# Patient Record
Sex: Female | Born: 1980
Health system: Southern US, Community
[De-identification: ages and names within clinical notes are randomized; demographics above are authoritative.]

## PROBLEM LIST (undated history)

## (undated) DIAGNOSIS — F988 Other specified behavioral and emotional disorders with onset usually occurring in childhood and adolescence: Secondary | ICD-10-CM

## (undated) DIAGNOSIS — I10 Essential (primary) hypertension: Secondary | ICD-10-CM

## (undated) DIAGNOSIS — J386 Stenosis of larynx: Secondary | ICD-10-CM

## (undated) DIAGNOSIS — E119 Type 2 diabetes mellitus without complications: Secondary | ICD-10-CM

## (undated) HISTORY — DX: Other specified behavioral and emotional disorders with onset usually occurring in childhood and adolescence: F98.8

## (undated) HISTORY — PX: BREAST BIOPSY: SHX20

---

## 2002-04-18 ENCOUNTER — Emergency Department (HOSPITAL_COMMUNITY): Admission: EM | Admit: 2002-04-18 | Discharge: 2002-04-18 | Payer: Self-pay | Admitting: Emergency Medicine

## 2002-05-27 ENCOUNTER — Emergency Department (HOSPITAL_COMMUNITY): Admission: EM | Admit: 2002-05-27 | Discharge: 2002-05-27 | Payer: Self-pay | Admitting: Emergency Medicine

## 2002-06-27 ENCOUNTER — Emergency Department (HOSPITAL_COMMUNITY): Admission: EM | Admit: 2002-06-27 | Discharge: 2002-06-27 | Payer: Self-pay | Admitting: Emergency Medicine

## 2002-06-27 ENCOUNTER — Encounter: Payer: Self-pay | Admitting: Emergency Medicine

## 2002-09-01 ENCOUNTER — Emergency Department (HOSPITAL_COMMUNITY): Admission: EM | Admit: 2002-09-01 | Discharge: 2002-09-01 | Payer: Self-pay | Admitting: Emergency Medicine

## 2002-10-21 ENCOUNTER — Emergency Department (HOSPITAL_COMMUNITY): Admission: EM | Admit: 2002-10-21 | Discharge: 2002-10-21 | Payer: Self-pay | Admitting: Emergency Medicine

## 2002-10-22 ENCOUNTER — Emergency Department (HOSPITAL_COMMUNITY): Admission: EM | Admit: 2002-10-22 | Discharge: 2002-10-23 | Payer: Self-pay | Admitting: Emergency Medicine

## 2002-10-22 ENCOUNTER — Encounter: Payer: Self-pay | Admitting: Emergency Medicine

## 2002-12-12 ENCOUNTER — Inpatient Hospital Stay (HOSPITAL_COMMUNITY): Admission: EM | Admit: 2002-12-12 | Discharge: 2002-12-14 | Payer: Self-pay | Admitting: Emergency Medicine

## 2002-12-12 ENCOUNTER — Encounter: Payer: Self-pay | Admitting: Emergency Medicine

## 2003-02-13 ENCOUNTER — Emergency Department (HOSPITAL_COMMUNITY): Admission: EM | Admit: 2003-02-13 | Discharge: 2003-02-14 | Payer: Self-pay | Admitting: Emergency Medicine

## 2003-02-13 ENCOUNTER — Encounter: Payer: Self-pay | Admitting: Emergency Medicine

## 2003-11-09 ENCOUNTER — Emergency Department (HOSPITAL_COMMUNITY): Admission: EM | Admit: 2003-11-09 | Discharge: 2003-11-09 | Payer: Self-pay

## 2003-12-02 ENCOUNTER — Emergency Department (HOSPITAL_COMMUNITY): Admission: EM | Admit: 2003-12-02 | Discharge: 2003-12-02 | Payer: Self-pay | Admitting: Emergency Medicine

## 2003-12-17 ENCOUNTER — Emergency Department (HOSPITAL_COMMUNITY): Admission: EM | Admit: 2003-12-17 | Discharge: 2003-12-17 | Payer: Self-pay | Admitting: Emergency Medicine

## 2004-01-18 ENCOUNTER — Emergency Department (HOSPITAL_COMMUNITY): Admission: EM | Admit: 2004-01-18 | Discharge: 2004-01-18 | Payer: Self-pay | Admitting: *Deleted

## 2004-02-02 ENCOUNTER — Emergency Department (HOSPITAL_COMMUNITY): Admission: EM | Admit: 2004-02-02 | Discharge: 2004-02-03 | Payer: Self-pay | Admitting: Emergency Medicine

## 2004-02-25 ENCOUNTER — Emergency Department (HOSPITAL_COMMUNITY): Admission: EM | Admit: 2004-02-25 | Discharge: 2004-02-25 | Payer: Self-pay | Admitting: Emergency Medicine

## 2004-03-18 ENCOUNTER — Emergency Department (HOSPITAL_COMMUNITY): Admission: EM | Admit: 2004-03-18 | Discharge: 2004-03-18 | Payer: Self-pay | Admitting: Emergency Medicine

## 2004-06-20 ENCOUNTER — Emergency Department (HOSPITAL_COMMUNITY): Admission: EM | Admit: 2004-06-20 | Discharge: 2004-06-20 | Payer: Self-pay | Admitting: Emergency Medicine

## 2004-08-18 ENCOUNTER — Emergency Department (HOSPITAL_COMMUNITY): Admission: EM | Admit: 2004-08-18 | Discharge: 2004-08-18 | Payer: Self-pay | Admitting: Emergency Medicine

## 2004-08-23 ENCOUNTER — Emergency Department (HOSPITAL_COMMUNITY): Admission: EM | Admit: 2004-08-23 | Discharge: 2004-08-23 | Payer: Self-pay | Admitting: Emergency Medicine

## 2004-08-24 ENCOUNTER — Emergency Department (HOSPITAL_COMMUNITY): Admission: EM | Admit: 2004-08-24 | Discharge: 2004-08-24 | Payer: Self-pay

## 2004-10-30 ENCOUNTER — Emergency Department (HOSPITAL_COMMUNITY): Admission: EM | Admit: 2004-10-30 | Discharge: 2004-10-30 | Payer: Self-pay | Admitting: *Deleted

## 2004-12-08 ENCOUNTER — Emergency Department (HOSPITAL_COMMUNITY): Admission: EM | Admit: 2004-12-08 | Discharge: 2004-12-08 | Payer: Self-pay | Admitting: Emergency Medicine

## 2005-01-06 ENCOUNTER — Emergency Department (HOSPITAL_COMMUNITY): Admission: EM | Admit: 2005-01-06 | Discharge: 2005-01-06 | Payer: Self-pay | Admitting: Emergency Medicine

## 2005-02-05 ENCOUNTER — Emergency Department (HOSPITAL_COMMUNITY): Admission: EM | Admit: 2005-02-05 | Discharge: 2005-02-05 | Payer: Self-pay | Admitting: Emergency Medicine

## 2005-02-15 ENCOUNTER — Emergency Department (HOSPITAL_COMMUNITY): Admission: EM | Admit: 2005-02-15 | Discharge: 2005-02-16 | Payer: Self-pay | Admitting: Emergency Medicine

## 2005-03-17 ENCOUNTER — Emergency Department (HOSPITAL_COMMUNITY): Admission: EM | Admit: 2005-03-17 | Discharge: 2005-03-17 | Payer: Self-pay | Admitting: Emergency Medicine

## 2005-04-12 ENCOUNTER — Emergency Department (HOSPITAL_COMMUNITY): Admission: EM | Admit: 2005-04-12 | Discharge: 2005-04-12 | Payer: Self-pay | Admitting: Emergency Medicine

## 2005-08-22 ENCOUNTER — Emergency Department (HOSPITAL_COMMUNITY): Admission: EM | Admit: 2005-08-22 | Discharge: 2005-08-22 | Payer: Self-pay | Admitting: Emergency Medicine

## 2005-09-12 ENCOUNTER — Emergency Department (HOSPITAL_COMMUNITY): Admission: EM | Admit: 2005-09-12 | Discharge: 2005-09-12 | Payer: Self-pay | Admitting: *Deleted

## 2005-11-23 ENCOUNTER — Emergency Department: Payer: Self-pay | Admitting: Emergency Medicine

## 2006-01-01 ENCOUNTER — Emergency Department: Payer: Self-pay | Admitting: Emergency Medicine

## 2006-02-22 ENCOUNTER — Emergency Department (HOSPITAL_COMMUNITY): Admission: EM | Admit: 2006-02-22 | Discharge: 2006-02-22 | Payer: Self-pay | Admitting: Emergency Medicine

## 2006-03-17 ENCOUNTER — Emergency Department (HOSPITAL_COMMUNITY): Admission: EM | Admit: 2006-03-17 | Discharge: 2006-03-17 | Payer: Self-pay | Admitting: Pediatrics

## 2006-06-03 ENCOUNTER — Emergency Department: Payer: Self-pay | Admitting: Emergency Medicine

## 2006-07-15 ENCOUNTER — Emergency Department (HOSPITAL_COMMUNITY): Admission: EM | Admit: 2006-07-15 | Discharge: 2006-07-15 | Payer: Self-pay | Admitting: Emergency Medicine

## 2006-07-26 ENCOUNTER — Ambulatory Visit: Payer: Self-pay | Admitting: Otolaryngology

## 2006-08-13 ENCOUNTER — Ambulatory Visit: Payer: Self-pay | Admitting: Unknown Physician Specialty

## 2007-01-07 ENCOUNTER — Ambulatory Visit: Payer: Self-pay | Admitting: Unknown Physician Specialty

## 2007-02-11 ENCOUNTER — Emergency Department: Payer: Self-pay | Admitting: Emergency Medicine

## 2007-02-17 ENCOUNTER — Emergency Department: Payer: Self-pay | Admitting: Emergency Medicine

## 2007-04-21 ENCOUNTER — Emergency Department: Payer: Self-pay | Admitting: Emergency Medicine

## 2007-06-13 ENCOUNTER — Ambulatory Visit: Payer: Self-pay | Admitting: Unknown Physician Specialty

## 2007-06-17 ENCOUNTER — Ambulatory Visit: Payer: Self-pay | Admitting: Unknown Physician Specialty

## 2007-08-23 ENCOUNTER — Emergency Department: Payer: Self-pay | Admitting: Emergency Medicine

## 2007-09-06 ENCOUNTER — Emergency Department (HOSPITAL_COMMUNITY): Admission: EM | Admit: 2007-09-06 | Discharge: 2007-09-06 | Payer: Self-pay | Admitting: Emergency Medicine

## 2007-10-20 ENCOUNTER — Emergency Department: Payer: Self-pay | Admitting: Emergency Medicine

## 2007-11-07 ENCOUNTER — Emergency Department (HOSPITAL_COMMUNITY): Admission: EM | Admit: 2007-11-07 | Discharge: 2007-11-07 | Payer: Self-pay | Admitting: Emergency Medicine

## 2007-12-19 DIAGNOSIS — E119 Type 2 diabetes mellitus without complications: Secondary | ICD-10-CM

## 2007-12-19 HISTORY — DX: Type 2 diabetes mellitus without complications: E11.9

## 2007-12-20 ENCOUNTER — Emergency Department: Payer: Self-pay | Admitting: Emergency Medicine

## 2008-01-28 ENCOUNTER — Emergency Department: Payer: Self-pay

## 2008-02-08 ENCOUNTER — Emergency Department: Payer: Self-pay | Admitting: Emergency Medicine

## 2008-02-08 ENCOUNTER — Other Ambulatory Visit: Payer: Self-pay

## 2008-04-25 ENCOUNTER — Emergency Department: Payer: Self-pay | Admitting: Emergency Medicine

## 2008-08-04 ENCOUNTER — Encounter: Admission: RE | Admit: 2008-08-04 | Discharge: 2008-08-04 | Payer: Self-pay | Admitting: Family Medicine

## 2008-08-26 ENCOUNTER — Ambulatory Visit (HOSPITAL_COMMUNITY): Admission: RE | Admit: 2008-08-26 | Discharge: 2008-08-26 | Payer: Self-pay | Admitting: Otolaryngology

## 2008-11-07 ENCOUNTER — Emergency Department: Payer: Self-pay | Admitting: Emergency Medicine

## 2008-11-11 ENCOUNTER — Ambulatory Visit (HOSPITAL_COMMUNITY): Admission: RE | Admit: 2008-11-11 | Discharge: 2008-11-12 | Payer: Self-pay | Admitting: Otolaryngology

## 2008-11-11 ENCOUNTER — Encounter (INDEPENDENT_AMBULATORY_CARE_PROVIDER_SITE_OTHER): Payer: Self-pay | Admitting: Otolaryngology

## 2008-12-18 HISTORY — PX: TRACHEOSTOMY: SUR1362

## 2009-03-15 ENCOUNTER — Encounter: Admission: RE | Admit: 2009-03-15 | Discharge: 2009-03-15 | Payer: Self-pay | Admitting: Family Medicine

## 2009-05-26 ENCOUNTER — Ambulatory Visit (HOSPITAL_COMMUNITY): Admission: RE | Admit: 2009-05-26 | Discharge: 2009-05-26 | Payer: Self-pay | Admitting: Otolaryngology

## 2009-05-27 ENCOUNTER — Inpatient Hospital Stay (HOSPITAL_COMMUNITY): Admission: EM | Admit: 2009-05-27 | Discharge: 2009-05-27 | Payer: Self-pay | Admitting: Emergency Medicine

## 2009-08-15 ENCOUNTER — Emergency Department: Payer: Self-pay | Admitting: Emergency Medicine

## 2009-10-25 ENCOUNTER — Encounter: Admission: RE | Admit: 2009-10-25 | Discharge: 2009-10-25 | Payer: Self-pay | Admitting: Family Medicine

## 2010-04-25 ENCOUNTER — Encounter: Admission: RE | Admit: 2010-04-25 | Discharge: 2010-04-25 | Payer: Self-pay | Admitting: Family Medicine

## 2011-03-27 LAB — GLUCOSE, CAPILLARY
Glucose-Capillary: 178 mg/dL — ABNORMAL HIGH (ref 70–99)
Glucose-Capillary: 184 mg/dL — ABNORMAL HIGH (ref 70–99)
Glucose-Capillary: 222 mg/dL — ABNORMAL HIGH (ref 70–99)
Glucose-Capillary: 265 mg/dL — ABNORMAL HIGH (ref 70–99)
Glucose-Capillary: 274 mg/dL — ABNORMAL HIGH (ref 70–99)

## 2011-03-27 LAB — BASIC METABOLIC PANEL
CO2: 29 mEq/L (ref 19–32)
Calcium: 9 mg/dL (ref 8.4–10.5)
GFR calc Af Amer: >60 mL/min (ref >60)
Glucose, Bld: 202 mg/dL — ABNORMAL HIGH (ref 70–99)
Sodium: 137 mEq/L (ref 135–145)

## 2011-03-27 LAB — CBC
HCT: 36.4 % (ref 36.0–46.0)
Hemoglobin: 11.9 g/dL — ABNORMAL LOW (ref 12.0–15.0)

## 2011-05-02 NOTE — Op Note (Signed)
NAMESHERRILL, BUIKEMA             ACCOUNT NO.:  [id number]   MEDICAL RECORD NO.:  [id number]          PATIENT TYPE:  AMB   LOCATION:  SDS                          FACILITY:  [facility]   PHYSICIAN:  Jefry H. Pollyann Kennedy, MD     DATE OF BIRTH:  1981/02/13   DATE OF PROCEDURE:  05/26/2009  DATE OF DISCHARGE:                               OPERATIVE REPORT   PREOPERATIVE DIAGNOSIS:  Subglottic stenosis, idiopathic.   POSTOPERATIVE DIAGNOSIS:  Subglottic stenosis, idiopathic.   PROCEDURE:  Microlaryngoscopy with laser treatment of subglottic  stenosis and dilatation.   ANESTHESIA:  General endotracheal anesthesia was used initially with jet  ventilation and subsequently at the termination of the procedure with  endotracheal intubation.   FINDINGS:  Subglottic eccentric stenosis.  Following treatment a 7.0  endotracheal tube was placed.   HISTORY:  A 30 year old diagnosed several years ago with idiopathic  subglottic stenosis requiring intermittent treatment.  Risks, benefits,  alternatives, and complications to the procedure were explained to the  patient, seemed to understand and agreed to surgery.   PROCEDURE IN DETAILS:  The patient was taken to the operating room and  placed on the operating room table in supine position.  Following  induction of general anesthesia, the table was turned and the patient  was draped in a standard fashion for upper endoscopy.  A maxillary  mandibular tooth protector were used.  Saline-soaked towels were placed  around the face and wet eye pads were placed for laser protection.  The  laser laryngoscope was entered into the oral cavity, used to view the  larynx, and secured to the Mayo stand with the suspension apparatus.  Jet ventilation was then used throughout the procedure, intermittently  stopped during the laser and dilation portions.  The CO2 laser was  hooked up to the operating microscope, which was then brought into the  field.  The area was  inspected again.  The laser with set at 2 watts  continuous power and radial incisions were made at 6 o'clock, 8 o'clock,  10 o'clock, 12 o'clock, and then 3 o'clock.  The stenotic area was then  dilated with [facility] laryngeal dilators up to 34-French.  That was a  largest dilator that would fit through the laryngoscope.  The  laryngoscope was then removed and using a anesthesia laryngoscope, a #7  endotracheal tube was placed.  The patient was then awakened, extubated,  and transferred to recovery in stable condition.      Jefry H. Pollyann Kennedy, MD  Electronically Signed     Jeannett Senior. Pollyann Kennedy, MD  Electronically Signed   JHR/MEDQ  D:  05/26/2009  T:  05/27/2009  Job:  [id number]

## 2011-05-02 NOTE — Op Note (Signed)
[name], Carol [name]             ACCOUNT NO.:  [id number]   MEDICAL RECORD NO.:  [id number]          PATIENT TYPE:  AMB   LOCATION:  SDS                          FACILITY:  [facility]   PHYSICIAN:  Jefry H. Pollyann Kennedy, MD     DATE OF BIRTH:  21-Sep-1981   DATE OF PROCEDURE:  08/26/2008  DATE OF DISCHARGE:  08/26/2008                               OPERATIVE REPORT   PREOPERATIVE DIAGNOSIS:  Subglottic stenosis.   POSTOPERATIVE DIAGNOSIS:  Subglottic stenosis.   PROCEDURE:  Microlaryngoscopy with laser excision of subglottic stenosis  and dilatation.   SURGEON:  Jefry H. Pollyann Kennedy, MD   General endotracheal anesthesia was used.   No complications.   No blood loss.   FINDINGS:  Subglottic stenosis approximately a centimeter below the  cords, circumferential but majority of the stenosis was posterior.  The  stenotic segment was very thin, less than half a centimeter.  Preop, the  stenosis was approximately 30 [place].  Postop, I was able to place a 40-  [place] dilator and a 7 endotracheal tube.  No complications.   REFERRING PHYSICIAN:  Dr. Daphine Deutscher.   HISTORY:  This is a 30 year old who was diagnosed with subglottic  stenosis about 3 years ago and had several procedures by Dr. Jenne Campus in  [place], has changed to [employer].  Risks, benefits,  alternatives, complications of the procedure were explained to the  patient, seemed to understand, and agreed to surgery.   PROCEDURE:  The patient was taken to the operating room, placed on the  operating table in a supine position.  Following induction of general  anesthesia, the table was turned and the laser laryngoscope was used to  view the larynx.  The jet ventilator was set up and was used for  ventilation during the procedure.  The ventilation was held during the  laser work and the dilatation.  The microscope was brought into view.  The suspension apparatus was used with the base on several towels on the  patient's chest as the  anatomy did not allow positioning of the Mayo  stand.  The stenosis was inspected and as described above.  The CO2  laser was used in the setting of 2 watts continuous power to make a  radial cut posteriorly, one at 9 o'clock and one at 12 o'clock.  I was  not able to angle properly at 3 o'clock and that was the least stenotic  aspect as well.  After the laser cuts were created, the [facility] dilators  were used starting at 14 working up to 38, which passed with slight  resistance.  The 42 would not fit through the laryngoscope so it was not  used.  Suctioning was used to evacuate any slight bleeding.  The  laryngoscope was  removed, and then the patient was intubated with a #7 endotracheal tube.  This fit snugly, but it fit and passed relatively well.  The patient was  then allowed to awaken from anesthesia, was then extubated, and  transferred to recovery in stable condition.      Jefry H. Pollyann Kennedy, MD  Electronically Signed  JHR/MEDQ  D:  08/26/2008  T:  08/26/2008  Job:  [id number]

## 2011-05-02 NOTE — Op Note (Signed)
NAMEJAYLEY, Carol [name]             ACCOUNT NO.:  [id number]   MEDICAL RECORD NO.:  [id number]          PATIENT TYPE:  OIB   LOCATION:  5120                         FACILITY:  [facility]   PHYSICIAN:  Jefry H. Pollyann Kennedy, MD     DATE OF BIRTH:  1981/03/17   DATE OF PROCEDURE:  11/11/2008  DATE OF DISCHARGE:                               OPERATIVE REPORT   PREOPERATIVE DIAGNOSIS:  Subglottic stenosis.   POSTOPERATIVE DIAGNOSES:  Subglottic stenosis and left vocal cord  granuloma.   SURGEON:  Jefry H. Pollyann Kennedy, MD.   ANESTHESIA:  General endotracheal anesthesia was used with jet  ventilation as well.   FINDINGS:  Large granuloma arising from the superior aspect of the left  vocal fold about one-third of the way back from the anterior commissure.  Subglottic stenosis about 1 cm below the cords, circumferential but  predominantly in the left anterior and the posterior section.  She  tolerated the procedure well.   HISTORY:  A 30 year old with idiopathic subglottic stenosis has been  treated several times recently by me a few months ago.  She started  having stridor again and shortness of breath.  Risks, benefits,  alternatives, and complications to the procedure were explained to the  patient, seemed to understand and agreed to surgery.   PROCEDURE:  The patient was taken to the operating room and placed on  the operating room table in supine position.  Following induction of  general anesthesia, the table was turned and the patient was draped in a  standard fashion.  A maxillary tooth protector was used throughout the  case.  A laser laryngoscope was entered into the oral cavity, used to  view the larynx, and attached to the Mayo stand with the suspension  apparatus.  Jet ventilation was used throughout the procedure, although  when the laser was used, apneic technique was used.  The glottic  granuloma was identified.  The mucosa was injected with Xylocaine with  epinephrine and then the  large granuloma was removed in its entirety and  sent for pathologic evaluation.  A carbon dioxide laser was used to  attach to the microscope in the setting of 2 watts continuous to laser  the base to destroy any additional granulation tissue and to provide  good hemostasis.  The scope was then advanced and the subglottic region  was inspected.  The stenotic area was as described above.  This was  treated with radial laser cuts at 12 o'clock, 10 o'clock, 8 o'clock, and  6 o'clock.  The stenotic segment was then dilated with [facility] dilators  up to #34.  A #7 endotracheal tube was then placed without difficulty.  The scope was removed.  The patient was then awakened, extubated, and  transferred to recovery in stable condition.      Jefry H. Pollyann Kennedy, MD  Electronically Signed     JHR/MEDQ  D:  11/11/2008  T:  11/11/2008  Job:  [id number]

## 2011-05-05 NOTE — Discharge Summary (Signed)
NAMEMARQUITTA, [name]                          ACCOUNT NO.:  [id number]   MEDICAL RECORD NO.:  [id number]                   PATIENT TYPE:  INP   LOCATION:  0369                                 FACILITY:  [facility]   PHYSICIAN:  Charlaine Dalton. Sherene Sires, M.D. [facility]           DATE OF BIRTH:  11-Dec-1981   DATE OF ADMISSION:  12/12/2002  DATE OF DISCHARGE:  12/14/2002                                 DISCHARGE SUMMARY   FINAL DIAGNOSES:  1. Respiratory distress secondary to vocal cord dysfunction.  2. Severe sinusitis, acute and chronic, probably triggering #1.  3. History of asthma and previously treated by Dr. Andria Meuse for multiple     springtime allergies.   HISTORY OF PRESENT ILLNESS:  Please see dictated H&P.  This is a 30 year old  black female, former smoker, who presented in respiratory distress and was  admitted with a tentative diagnosis of asthma, but whom appeared to have  much more upper airway wheezing.  Laboratory work was unremarkable,  including the absence of significant eosinophilia.  Sinus CT scan showed  evidence of acute and chronic sinusitis.  She was treated with nebulizers  and topical therapy, and responded quite dramatically.  All of her symptoms  of coughing and wheezing resolved on the day of discharge, and she was  ambulatory without significant dyspnea.   She is therefore being discharged for follow up in the office in a week on  the following medications:   DISCHARGE MEDICATIONS:  1. Augmentin 875 mg one b.i.d. x8 days (she will complete 10 full days of     therapy).  2. Protonix 40 mg b.i.d. before meals (or she can use Prilosec over-the-     counter).  3. Prednisone 40 mg q.d. to be tapered off over the next eight days.  4. Afrin one puff b.i.d. and then followed by _______ 2 puffs b.i.d.     perfectly regularly.  She is to stop Afrin after five days.  5. For cough, she can use Robitussin DM 2 teaspoons q.4h.  6. For severe cough she can add Tylenol No. 3 one  q.4h.  7. Albuterol 2 puffs q.4h. p.r.n.   Note that she was very inconsistent about taking her medications prior to  admission, did not appear to be responding to Advair, Flovent, and Singular.  It is not clear whether these were not working or they were directed toward  the wrong problem.  My feeling is that most of her problems are upper  airway/sinus related.  I did leave her on Pulmicort 2 puffs b.i.d. because I  found that it is the least irritating to the upper airway of all the inhaled  agents.   DIET:  Normal.   ACTIVITY:  Normal.   CONDITION ON DISCHARGE:  She is discharged in markedly improved condition  with no audible wheezing or stridor.  Charlaine Dalton. Sherene Sires, M.D. [facility]   MBW/MEDQ  D:  12/14/2002  T:  12/14/2002  Job:  [id number]

## 2011-05-05 NOTE — H&P (Signed)
NAMECLARANN, Carol [name]                          ACCOUNT NO.:  [id number]   MEDICAL RECORD NO.:  [id number]                   PATIENT TYPE:  INP   LOCATION:  0369                                 [facility]ACILITY:  [facility]   PHYSICIAN:  Charlaine Dalton. Sherene Sires, M.D. [facility]           DATE OF BIRTH:  03/28/1981   DATE OF ADMISSION:  12/12/2002  DATE OF DISCHARGE:                                HISTORY & PHYSICAL   CHIEF COMPLAINT:  Respiratory distress.   HISTORY OF PRESENT ILLNESS:  This is a 30 year old black female, former  smoker, who was diagnosed with asthma in February 2003, at [facility] and sent to  Dr. Andria Meuse for evaluation.  He found that she had positive skin testing for  multiple spring-type allergens (the patient denied any history of springtime  allergy) plus dust.  She was placed on allergy shots and multiple asthma  medications.  She states that even when she takes prednisone she is not able  to achieve complete normal lung function and has had shortness of breath  with exertion, persistent hoarseness, and intermittent flareups of asthma  consisting of severe coughing paroxysms with tightness in her throat  associated with a sensation of sore throat intermittently productive of  yellow sputum but improving on prednisone.  In between flares, she states  she is not consistent about taking Advair or Flovent, both because of cost  but also because she feels it makes her throat worse.   She came to the emergency room last night after having run out of prednisone  three weeks ago with an acute flareup over the last several days associated  with the typical pattern again of hacking cough, throat soreness/tightness,  severe hoarseness, and was found in the emergency room to be frankly  stridorous.  She was treated with racemic epinephrine and magnesium sulfate  and is feeling a little bit better now but was referred for refractory  asthma management by Dr. Lynelle Doctor, and I agreed to admit her.   The  patient denies any active sinus or reflux symptoms, itching or sneezing,  chest pain of any kind, fevers, chills, abdominal or chest complaints, rash,  arthralgias or myalgias.   PAST SURGICAL HISTORY:  None.   PAST MEDICAL ILLNESSES:  None.   ALLERGIES:  None.   SOCIAL HISTORY:  She lives alone in an apartment, having graduated from [employer] in August 2003.  She denies any pet exposure.  She quit smoking in March  2003.   FAMILY HISTORY:  Positive for asthma on her mother's side of the family but  only in aunts and cousins, not in any direct relatives.  Her mother, father,  and brother are all healthy.   REVIEW OF SYSTEMS:  Taken in detail and essentially negative except as  outlined above.  In addition to the above, the patient denies any fever,  dysphagia, or recent change in weight.   PHYSICAL EXAMINATION:  GENERAL:  This is a very pleasant black female with  obvious inspiratory stridor, in no acute distress.  VITAL SIGNS:  Afebrile.  HEENT:  Remarkably unremarkable.  That is, oropharynx is clear with no  evidence of erythema or tonsillar enlargement.  Nasal turbinates are normal.  Ear canals are clear bilaterally.  NECK:  Supple.  Without cervical adenopathy, tenderness.  No thyromegaly.  LUNGS:  Lung fields reveal classic pseudowheeze bilaterally, almost all  inspiratory rather than expiratory.  HEART:  There is a regular rate and rhythm without murmur, gallop, or rub  present.  No displacement of the PMI or tachycardia.  ABDOMEN:  Soft and nontender.  EXTREMITIES:  Warm.  Without calf tenderness, cyanosis, clubbing, or edema.   LABORATORY DATA:  Hematocrit 35.6% with MCV 83.  Chemistry profile was  normal except for potassium 3.1.   Chest x-ray was normal.  Soft tissues of the neck did not show any evidence  of epiglottitis.   IMPRESSION:  Classic vocal cord dysfunction syndrome masquerading as asthma  with partial improvement from prednisone indicating there may be  an  inflammatory component, but we do not really have a handle on what is  driving the inflammatory component of her upper airway instability.  I doubt  that it is a classic allergic mechanism, at least based on the allergies she  told me about because she seems to flare more with cold weather than she  does during the spring.  She mostly has springtime allergies and did not  have a previous history of anything to suggest seasonal rhinitis.   We also have the problem with this patient being noncompliant that we will  need to overcome.  Part of her noncompliance is financial and part of it is  related to topical side effects of drugs.  I think Pulmicort would probably  be the best choice for her as an inhaler if, in fact, there is a true  asthmatic component and will evaluate her for sinusitis, treat her with high-  dose PPI therapy for reflux, and consider referring her to [facility] if her  symptoms remain refractory.                                               Charlaine Dalton. Sherene Sires, M.D. [facility]    MBW/MEDQ  D:  12/12/2002  T:  12/12/2002  Job:  [id number]

## 2011-05-05 NOTE — Consult Note (Signed)
NAMETEARIA, GIBBS NO.:  [id number]   MEDICAL RECORD NO.:  [id number]                   PATIENT TYPE:  EMS   LOCATION:  ED                                   FACILITY:  [facility]   PHYSICIAN:  Dionne Ano. Everlene Other, M.D.         DATE OF BIRTH:  January 24, 1981   DATE OF CONSULTATION:  DATE OF DISCHARGE:                                   CONSULTATION   CONSULTING PHYSICIAN:  Dionne Ano. Amanda Pea, M.D.   Carol [name] was seen in the [facility] Room, upon the referral  from Dr. Carren Rang.  This patient is a very pleasant female who is aged  30 and right hand dominant, and has noted left index finger pain over the  past 36 hours.  She noted swelling and significant pain yesterday.  She  notes no numbness or tingling in the hand.  She notes no history of  rheumatoid arthritis, gout, pseudogout, or other problems.  She denies  history of puncture wound, laceration, abrasion, or other problems to the  index finger.  This appeared to come on __________  .  She notes no fever,  chills, nausea, vomiting, other constitutional symptoms, or significant  problems.  The patient states over the last few hours, the pain has lessened  somewhat.  I was asked to see her in regards to her upper extremity  predicament.  She was originally seen by Dr. Beverely Pace in the ER.   PAST MEDICAL HISTORY:  Asthma.   PAST SURGICAL HISTORY:  None.   MEDICATIONS:  Advair inhaler, Albuterol p.r.n. and Singulair.   ALLERGIES:  None.   SOCIAL HISTORY:  She does not smoke or drink.  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   PHYSICAL EXAMINATION:  GENERAL:  The patient is alert and oriented  in no  acute distress.  VITAL SIGNS:  Stable.  HEENT:  The patient has a normal HEENT examination.  CHEST:  Clear.  BACK:  Nontender.  NECK:  Nontender.  EXTREMITIES:  Lower extremity examination is benign.  Right upper extremity  is neurovascularly intact.  __________  range of  motion.  Left upper  extremity has an index finger with some swelling and mild pain over the A1  pulley; however, she has no Kanavel's signs, and she is nontender along the  flexor sheath about the volar aspect of the proximal, middle, and distal  phalanxes.  There is no instability or significant motion tenderness about  the joint overlying the MCP, DIP, and PIP joints.  I see no evidence of  septic arthritis.  I have reviewed this at length with her.  The collateral  ligaments are intact.  She has no obvious abscess or pus pockets.  Thenar,  hypothenar, and mid palm are normal to palpation and exam.   We have reviewed this at length.   Her x-rays are negative about the views reviewed today.  Her white count is 5.9.  Her hematocrit is 32.  Platelets are 257.  Her uric  acid is 3.9.   IMPRESSION:  A 36 hour history of acute tenosynovitis, left index finger,  can not rule out early cellulitic or infectious process.   PLAN:  I have placed her on Indocin 25 mg one p.o. t.i.d. as well as Keflex  500 mg one p.o. q.i.d. to be on the cautious side.  Occasionally cellulitis  or early tenosynovitis can develop into an infection, and we want to make  sure we guard against this.  Thus, we are going to have her elevate,  continue the medicines as dispensed today, and see me back in the office  Monday.  I have discussed with her do's and don't's, etcetera, and all  questions have been encouraged and answered.  We will ask her to notify  should any worsening occur.  I have given her my card and asked her to call  me immediately should any worsening occur over the weekend.  I have fitted  her with a finger splint for rest and discussed with her other measures  today in regards to the conservative care on her finger.  I do not see any  signs of acute neurologic dysfunction, acute compartment syndrome, or  advancing cellulitis, or a deep abscess.  She understands the proposed  treatment algorithm  and all issues.                                               Dionne Ano. Everlene Other, M.D.    [name]  D:  03/18/2004  T:  03/19/2004  Job:  [id number]   cc:   Carren Rang, M.D.

## 2011-09-19 LAB — BASIC METABOLIC PANEL
BUN: 9
Calcium: 9.2
Chloride: 101
Creatinine, Ser: 0.6
GFR calc non Af Amer: >60
Potassium: 3.6
Sodium: 138

## 2011-09-19 LAB — CBC
Platelets: 315
RBC: 4.19
RDW: 12.6

## 2011-09-19 LAB — GLUCOSE, CAPILLARY: Glucose-Capillary: 202 — ABNORMAL HIGH

## 2011-09-20 LAB — BASIC METABOLIC PANEL
BUN: 8
Chloride: 98
GFR calc Af Amer: >60
GFR calc non Af Amer: >60
Potassium: 4.1

## 2011-09-20 LAB — CBC
HCT: 39.5
MCV: 84.8
Platelets: 281
RBC: 4.66
WBC: 12 — ABNORMAL HIGH

## 2011-09-20 LAB — GLUCOSE, CAPILLARY: Glucose-Capillary: 227 — ABNORMAL HIGH

## 2012-06-17 ENCOUNTER — Emergency Department: Payer: Self-pay | Admitting: Emergency Medicine

## 2012-08-26 DIAGNOSIS — Z930 Tracheostomy status: Secondary | ICD-10-CM | POA: Insufficient documentation

## 2014-02-16 ENCOUNTER — Emergency Department: Payer: Self-pay | Admitting: Emergency Medicine

## 2014-02-16 LAB — URINALYSIS, COMPLETE
BACTERIA: NONE SEEN
Bilirubin,UR: NEGATIVE
Leukocyte Esterase: NEGATIVE
NITRITE: NEGATIVE
PH: 6 (ref 4.5–8.0)
RBC,UR: 8 /HPF (ref 0–5)
Specific Gravity: 1.02 (ref 1.003–1.030)

## 2014-02-16 LAB — BASIC METABOLIC PANEL
ANION GAP: 5 — AB (ref 7–16)
BUN: 10 mg/dL (ref 7–18)
Calcium, Total: 8.7 mg/dL (ref 8.5–10.1)
Chloride: 101 mmol/L (ref 98–107)
Co2: 28 mmol/L (ref 21–32)
Creatinine: 0.68 mg/dL (ref 0.60–1.30)
EGFR (Non-African Amer.): >60
Glucose: 204 mg/dL — ABNORMAL HIGH (ref 65–99)
Osmolality: 273 (ref 275–301)
POTASSIUM: 3.2 mmol/L — AB (ref 3.5–5.1)
SODIUM: 134 mmol/L — AB (ref 136–145)

## 2014-02-16 LAB — CBC WITH DIFFERENTIAL/PLATELET
BASOS PCT: 0.8 %
Basophil #: 0.1 10*3/uL (ref 0.0–0.1)
EOS ABS: 0.1 10*3/uL (ref 0.0–0.7)
Eosinophil %: 1.1 %
HCT: 37.5 % (ref 35.0–47.0)
HGB: 12.2 g/dL (ref 12.0–16.0)
LYMPHS PCT: 27.2 %
Lymphocyte #: 1.9 10*3/uL (ref 1.0–3.6)
MCH: 26.4 pg (ref 26.0–34.0)
MCHC: 32.6 g/dL (ref 32.0–36.0)
MCV: 81 fL (ref 80–100)
MONO ABS: 0.4 x10 3/mm (ref 0.2–0.9)
Monocyte %: 6 %
NEUTROS ABS: 4.6 10*3/uL (ref 1.4–6.5)
NEUTROS PCT: 64.9 %
Platelet: 315 10*3/uL (ref 150–440)
RBC: 4.63 10*6/uL (ref 3.80–5.20)
RDW: 13.3 % (ref 11.5–14.5)
WBC: 7 10*3/uL (ref 3.6–11.0)

## 2014-02-16 LAB — TROPONIN I

## 2015-05-14 ENCOUNTER — Encounter: Payer: Self-pay | Admitting: Emergency Medicine

## 2015-05-14 ENCOUNTER — Emergency Department
Admission: EM | Admit: 2015-05-14 | Discharge: 2015-05-14 | Disposition: A | Discharge status: Home / Self Care | Payer: Self-pay | Attending: Emergency Medicine | Admitting: Emergency Medicine

## 2015-05-14 ENCOUNTER — Other Ambulatory Visit: Payer: Self-pay

## 2015-05-14 DIAGNOSIS — R55 Syncope and collapse: Secondary | ICD-10-CM | POA: Insufficient documentation

## 2015-05-14 DIAGNOSIS — I10 Essential (primary) hypertension: Secondary | ICD-10-CM | POA: Insufficient documentation

## 2015-05-14 DIAGNOSIS — E119 Type 2 diabetes mellitus without complications: Secondary | ICD-10-CM | POA: Insufficient documentation

## 2015-05-14 HISTORY — DX: Essential (primary) hypertension: I10

## 2015-05-14 HISTORY — DX: Type 2 diabetes mellitus without complications: E11.9

## 2015-05-14 HISTORY — DX: Stenosis of larynx: J38.6

## 2015-05-14 LAB — CBC WITH DIFFERENTIAL/PLATELET
BASOS ABS: 0 10*3/uL (ref 0–0.1)
BASOS PCT: 1 %
Eosinophils Absolute: 0.1 10*3/uL (ref 0–0.7)
Eosinophils Relative: 1 %
HCT: 36.3 % (ref 35.0–47.0)
Hemoglobin: 11.6 g/dL — ABNORMAL LOW (ref 12.0–16.0)
Lymphocytes Relative: 24 %
Lymphs Abs: 1.6 10*3/uL (ref 1.0–3.6)
MCH: 26 pg (ref 26.0–34.0)
MCHC: 32.1 g/dL (ref 32.0–36.0)
MCV: 81.1 fL (ref 80.0–100.0)
Monocytes Absolute: 0.5 10*3/uL (ref 0.2–0.9)
Monocytes Relative: 8 %
NEUTROS PCT: 66 %
Neutro Abs: 4.4 10*3/uL (ref 1.4–6.5)
Platelets: 297 10*3/uL (ref 150–440)
RBC: 4.48 MIL/uL (ref 3.80–5.20)
RDW: 13.5 % (ref 11.5–14.5)
WBC: 6.6 10*3/uL (ref 3.6–11.0)

## 2015-05-14 LAB — BASIC METABOLIC PANEL
Anion gap: 5 (ref 5–15)
BUN: 12 mg/dL (ref 6–20)
CALCIUM: 8.6 mg/dL — AB (ref 8.9–10.3)
CO2: 29 mmol/L (ref 22–32)
CREATININE: 0.61 mg/dL (ref 0.44–1.00)
Chloride: 102 mmol/L (ref 101–111)
GFR calc Af Amer: >60 mL/min (ref >60)
GLUCOSE: 221 mg/dL — AB (ref 65–99)
POTASSIUM: 3.8 mmol/L (ref 3.5–5.1)
Sodium: 136 mmol/L (ref 135–145)

## 2015-05-14 NOTE — Discharge Instructions (Signed)
Near-Syncope Near-syncope (commonly known as near fainting) is sudden weakness, dizziness, or feeling like you might pass out. During an episode of near-syncope, you may also develop pale skin, have tunnel vision, or feel sick to your stomach (nauseous). Near-syncope may occur when getting up after sitting or while standing for a long time. It is caused by a sudden decrease in blood flow to the brain. This decrease can result from various causes or triggers, most of which are not serious. However, because near-syncope can sometimes be a sign of something serious, a medical evaluation is required. The specific cause is often not determined. HOME CARE INSTRUCTIONS  Monitor your condition for any changes. The following actions may help to alleviate any discomfort you are experiencing:  Have someone stay with you until you feel stable.  Lie down right away and prop your feet up if you start feeling like you might faint. Breathe deeply and steadily. Wait until all the symptoms have passed. Most of these episodes last only a few minutes. You may feel tired for several hours.   Drink enough fluids to keep your urine clear or pale yellow.   If you are taking blood pressure or heart medicine, get up slowly when seated or lying down. Take several minutes to sit and then stand. This can reduce dizziness.  Follow up with your health care provider as directed. SEEK IMMEDIATE MEDICAL CARE IF:   You have a severe headache.   You have unusual pain in the chest, abdomen, or back.   You are bleeding from the mouth or rectum, or you have black or tarry stool.   You have an irregular or very fast heartbeat.   You have repeated fainting or have seizure-like jerking during an episode.   You faint when sitting or lying down.   You have confusion.   You have difficulty walking.   You have severe weakness.   You have vision problems.  MAKE SURE YOU:   Understand these instructions.  Will  watch your condition.  Will get help right away if you are not doing well or get worse. Document Released: 12/04/2005 Document Revised: 12/09/2013 Document Reviewed: 05/09/2013 ExitCare Patient Information 2015 [employer]. This information is not intended to replace advice given to you by your health care provider. Make sure you discuss any questions you have with your health care provider.  

## 2015-05-14 NOTE — ED Notes (Signed)
Arrived to ED via EMS. Patient had a dental procedure done around 830 this morning, has had them multiple times in the past with general anesthesia and has done well.  After today's procedure, patient had syncopable episode where she was out for a few seconds per family member. Patient had initially thought it was her blood sugar, but upon checking it by EMS is 164.  At the present time, patient states she is feeling better. No c/o pain.

## 2015-05-14 NOTE — ED Provider Notes (Signed)
[facility] Emergency Department Provider Note  ____________________________________________  Time seen: 1220  I have reviewed the triage vital signs and the nursing notes.   HISTORY  Chief Complaint Near Syncope     HPI Carol [name] is a 34 y.o. female with a history of diabetes, hypertension, subglottic stenosis with trach, who had dental surgery this morning. All had gone WELL. SHE HAD COMPLETED THE SURGERY AND WAS OUT AND GETTING SOMETHING TO EAT WHEN SHE BEGAN TO FEEL WEAK AND COLLAPSED. SHE DID NOT LOSE CONSCIOUSNESS. THE COLLAPSE ONLY LASTED FOR A FEW SECONDS ACCORDING TO HER SIGNIFICANT OTHER AND HER. SHE DID NOT HAVE ANY CHEST PAIN OR PALPITATIONS. SHE HAD NOT EATEN SINCE 10 PM LAST NIGHT DUE TO BEING NOTHING BY MOUTH FOR HER DENTAL PROCEDURE THIS MORNING. SHE HAD TAKEN HER REGULAR DOSE OF LANTUS LAST NIGHT. HER GLUCOSE PER EMS ON THEIR ARRIVAL WAS 160. SHE WAS TREATED WITH A SMALL AMOUNT OF GLUCOSE ORAL SOLUTION. SHE QUICKLY RECOVERED. SHE REPORTS SHE FEELS WELL NOW.     Past Medical History  Diagnosis Date  . Hypertension   . Diabetes mellitus without complication   . Subglottic stenosis     Trach collar    There are no active problems to display for this patient.   Past Surgical History  Procedure Laterality Date  . Tracheostomy      No current outpatient prescriptions on file.  Allergies Review of patient's allergies indicates no known allergies.  Family History  Problem Relation Age of Onset  . Family history unknown: Yes    Social History History  Substance Use Topics  . Smoking status: Never Smoker   . Smokeless tobacco: Never Used  . Alcohol Use: Not on file    Review of Systems  Constitutional: Negative for fever. ENT: Negative for sore throat. Notable for history of subglottic stenosis and trach performed in 2010. No recent changes. Cardiovascular: Negative for chest pain. Respiratory: Negative for shortness  of breath. Gastrointestinal: Negative for abdominal pain, vomiting and diarrhea. Genitourinary: Negative for dysuria. Musculoskeletal: Negative for back pain. Skin: Negative for rash. Neurological: Negative for headaches   10-point ROS otherwise negative.  ____________________________________________   PHYSICAL EXAM:  VITAL SIGNS: ED Triage Vitals  Enc Vitals Group     BP 05/14/15 1216 114/74 mmHg     Pulse Rate 05/14/15 1216 77     Resp 05/14/15 1216 17     Temp 05/14/15 1216 97.5 F (36.4 C)     Temp Source 05/14/15 1216 Oral     SpO2 05/14/15 1216 100 %     Weight 05/14/15 1216 185 lb (83.915 kg)     Height 05/14/15 1216  (1.626 m)     Head Cir --      Peak Flow --      Pain Score --      Pain Loc --      Pain Edu? --      Excl. in GC? --     Constitutional: Very pleasant Alert and oriented. Well appearing and in no distress. ENT   Head: Normocephalic and atraumatic.   Nose: No congestion/rhinnorhea.   Mouth/Throat: Mucous membranes are moist.      Neck: Tracheostomy noted. No acute issues. Cardiovascular: Normal rate, regular rhythm. Respiratory: Normal respiratory effort without tachypnea. Breath sounds are clear and equal bilaterally. No wheezes/rales/rhonchi. Gastrointestinal: Soft and nontender. No distention.  Back: No muscle spasm, no tenderness, no CVA tenderness. Musculoskeletal: Nontender with normal range  of motion in all extremities.  No noted edema. Neurologic:  Normal speech and language. No gross focal neurologic deficits are appreciated.  Skin:  Skin is warm, dry. No rash noted. Psychiatric: Mood and affect are normal. Speech and behavior are normal.  ____________________________________________    LABS (pertinent positives/negatives)  Hemoglobin 11.6, white blood cell, 6.6. Metabolic panel shows glucose at 221. Remainder of metabolic panel within normal limits. Potassium of  3.8.  ____________________________________________   EKG  ED ECG REPORT I, Rudy Domek W, the attending physician, personally viewed and interpreted this ECG.   Date: 05/14/2015  EKG Time: 1211  Rate: 70  Rhythm: normal EKG, normal sinus rhythm, Axis: 39  Intervals: Within normal limits  ST&T Change: None  ____________________________________________   INITIAL IMPRESSION / ASSESSMENT AND PLAN / ED COURSE  Very pleasant well-appearing 34 year old female in no acute distress. She had a brief episode of weakness. She reports she feels well now. We will do a quick check on her blood count and sugar level and metabolic panel. If all looks well with normal discharge her home.  ----------------------------------------- 1:43 PM on 05/14/2015 -----------------------------------------  Patient remains alert and comfortable. No acute distress. Labs look okay. EKG is normal. We will discharge the patient to continue her usual care at home.  ____________________________________________   FINAL CLINICAL IMPRESSION(S) / ED DIAGNOSES  Final diagnoses:  Near syncope      Darien Ramusavid W Yi Falletta, MD 05/14/15 1344

## 2016-04-03 ENCOUNTER — Encounter: Payer: Self-pay | Admitting: Nurse Practitioner

## 2016-04-03 ENCOUNTER — Ambulatory Visit (INDEPENDENT_AMBULATORY_CARE_PROVIDER_SITE_OTHER): Payer: BLUE CROSS/BLUE SHIELD | Admitting: Nurse Practitioner

## 2016-04-03 VITALS — BP 149/91 | HR 98 | Temp 98.6°F | Ht 64.5 in | Wt 171.2 lb

## 2016-04-03 DIAGNOSIS — Z7689 Persons encountering health services in other specified circumstances: Secondary | ICD-10-CM

## 2016-04-03 DIAGNOSIS — E119 Type 2 diabetes mellitus without complications: Secondary | ICD-10-CM | POA: Diagnosis not present

## 2016-04-03 DIAGNOSIS — Z7189 Other specified counseling: Secondary | ICD-10-CM

## 2016-04-03 DIAGNOSIS — Z930 Tracheostomy status: Secondary | ICD-10-CM

## 2016-04-03 DIAGNOSIS — R41840 Attention and concentration deficit: Secondary | ICD-10-CM

## 2016-04-03 DIAGNOSIS — J386 Stenosis of larynx: Secondary | ICD-10-CM

## 2016-04-03 DIAGNOSIS — I10 Essential (primary) hypertension: Secondary | ICD-10-CM | POA: Insufficient documentation

## 2016-04-03 DIAGNOSIS — J45909 Unspecified asthma, uncomplicated: Secondary | ICD-10-CM | POA: Insufficient documentation

## 2016-04-03 DIAGNOSIS — Z794 Long term (current) use of insulin: Secondary | ICD-10-CM

## 2016-04-03 MED ORDER — AMLODIPINE-OLMESARTAN 10-20 MG PO TABS: 1.0000 | ORAL_TABLET | Freq: Every day | ORAL | Status: DC

## 2016-04-03 MED ORDER — INSULIN GLARGINE 100 UNIT/ML SOLOSTAR PEN: 60.0000 [IU] | PEN_INJECTOR | Freq: Every day | SUBCUTANEOUS | Status: DC

## 2016-04-03 NOTE — Patient Instructions (Addendum)
Welcome to [facility]!  Nice to meet you.  Please sign up for MyChart- last page of this handout   We will call you about your referral.

## 2016-04-03 NOTE — Assessment & Plan Note (Signed)
Discussed acute and chronic issues. Reviewed health maintenance measures, PFSHx, and immunizations. Obtain records from [facility] primary care division

## 2016-04-03 NOTE — Progress Notes (Signed)
Pre visit review using our clinic review tool, if applicable. No additional management support is needed unless otherwise documented below in the visit note. 

## 2016-04-03 NOTE — Progress Notes (Signed)
Patient ID: Carol [name], female    DOB: [birth year]  Age: 35 y.o. MRN: [id number]  CC: Establish Care   HPI Carol [name] presents for establishing care and CC of decreased concentration and med refills.   1) New Pt Info:   Immunizations- Need records  Pap- 10/2014   Eye- 02/2016- glasses, every year   2) Chronic Problems-  Subglottic stenosis- 8 prior surgeries with dilation prior to a trach in 2010  Type 2 DM- Lantus is keeping it under control    120's- 300's  Carol [name] at [facility]  HTN- 2 months off of the medication   3) Acute Problems- Needs medication refills on Lantus solostar and Azor.  Pen needles ultrafine  Unsure name of meter and strips/lancets- denies needing refills at this time  1) Decreased concentration Difficulty focusing and keeping up with task  2-3 years Affecting life  She is working on the process of adopting a child and finds she is forgetful  No formal diagnosis  Fidgity- but not anxiety   History Carol [name] has a past medical history of Hypertension; Diabetes mellitus without complication (HCC); Subglottic stenosis; and Asthma.   She has past surgical history that includes Tracheostomy.   Her family history includes Diabetes in her father; Hypertension in her brother and father.She reports that she quit smoking about 14 years ago. She has never used smokeless tobacco. She reports that she drinks alcohol. She reports that she does not use illicit drugs.  No outpatient prescriptions prior to visit.   No facility-administered medications prior to visit.    ROS Review of Systems  Constitutional: Negative for fever, chills, diaphoresis and fatigue.  Respiratory: Negative for chest tightness, shortness of breath and wheezing.   Cardiovascular: Negative for chest pain, palpitations and leg swelling.  Gastrointestinal: Negative for nausea, vomiting and diarrhea.  Endocrine: Negative for polydipsia, polyphagia and polyuria.   Skin: Negative for rash.  Neurological: Negative for dizziness, weakness, numbness and headaches.  Psychiatric/Behavioral: Positive for decreased concentration. Negative for suicidal ideas, hallucinations, sleep disturbance and self-injury. The patient is not nervous/anxious and is not hyperactive.     Objective:  BP 149/91 mmHg  Pulse 98  Temp(Src) 98.6 F (37 C) (Oral)  Ht 5' 4.5" (1.638 m)  Wt 171 lb 4 oz (77.678 kg)  BMI 28.95 kg/m2  SpO2 99%  LMP 03/18/2016  Physical Exam  Constitutional: She is oriented to person, place, and time. She appears well-developed and well-nourished. No distress.  HENT:  Head: Normocephalic and atraumatic.  Right Ear: External ear normal.  Left Ear: External ear normal.  Neck:  Tracheostomy in good shape  Cardiovascular: Normal rate, regular rhythm and normal heart sounds.   Pulmonary/Chest: Effort normal and breath sounds normal. No respiratory distress. She has no wheezes. She has no rales. She exhibits no tenderness.  Neurological: She is alert and oriented to person, place, and time. No cranial nerve deficit. She exhibits normal muscle tone. Coordination normal.  Skin: Skin is warm and dry. No rash noted. She is not diaphoretic.  Psychiatric: She has a normal mood and affect. Her behavior is normal. Judgment and thought content normal.   Assessment & Plan:   Carol [name] was seen today for establish care.  Diagnoses and all orders for this visit:  Subglottic stenosis  Encounter to establish care  Benign essential HTN  Type 2 diabetes mellitus without complication, with long-term current use of insulin (HCC)  Tracheostomy present (HCC)  Concentration deficit  Other orders -  Insulin Glargine (LANTUS SOLOSTAR) 100 UNIT/ML Solostar Pen; Inject 60 Units into the skin daily at 10 pm. -     amlodipine-olmesartan (AZOR) 10-20 MG tablet; Take 1 tablet by mouth daily.   I have discontinued Carol [name] insulin glargine. I have also  changed her amlodipine-olmesartan. Additionally, I am having her start on Insulin Glargine.  Meds ordered this encounter  Medications  . DISCONTD: insulin glargine (LANTUS) 100 UNIT/ML injection    Sig: Inject 60 Units into the skin at bedtime.  Marland Kitchen. DISCONTD: amlodipine-olmesartan (AZOR) 10-20 MG tablet    Sig: Take by mouth.  . Insulin Glargine (LANTUS SOLOSTAR) 100 UNIT/ML Solostar Pen    Sig: Inject 60 Units into the skin daily at 10 pm.    Dispense:  15 mL    Refill:  11    Order Specific Question:  Supervising Provider    Answer:  Duncan DullULLO, TERESA L [[id number]]  . amlodipine-olmesartan (AZOR) 10-20 MG tablet    Sig: Take 1 tablet by mouth daily.    Dispense:  90 tablet    Refill:  3    Order Specific Question:  Supervising Provider    Answer:  Sherlene ShamsULLO, TERESA L [[id number]]     Follow-up: Return in about 3 months (around 07/03/2016) for Follow up.

## 2016-04-09 DIAGNOSIS — F909 Attention-deficit hyperactivity disorder, unspecified type: Secondary | ICD-10-CM | POA: Insufficient documentation

## 2016-04-09 NOTE — Assessment & Plan Note (Signed)
Patient is a most out of Lantus SoloSTAR pen Sent refills to pharmacy patient is using 60 units at 10 PM She does not know the name of her meter currently and is not checking consistently Patient does deny lows blood sugars running 120s to 300s We'll obtain records of last A1c Patient does not need lancets or pen Needles at this time and will let me know in case of need

## 2016-04-09 NOTE — Assessment & Plan Note (Signed)
Patient is doing very well with a tracheostomy Voice is strong and she is having no difficulty communicating today Follow up with specialists as needed

## 2016-04-09 NOTE — Assessment & Plan Note (Signed)
New onset Patient reports she has been swimming over to 3 years but has not discussed with another provider She reports it is affecting her quality of life and she is in the process of adopting and would like to have formal testing done by psychiatry Referral for ADHD testing placed

## 2016-04-09 NOTE — Assessment & Plan Note (Signed)
BP Readings from Last 3 Encounters:  04/03/16 149/91  05/14/15 117/79   Slightly elevated today patient has been without medications for 2 months. Sent to pharmacy today asked her to pick up and take as soon as possible

## 2016-04-09 NOTE — Assessment & Plan Note (Signed)
Stable today Has plenty of supplies for tracheostomy care Follow[facility]

## 2016-04-26 ENCOUNTER — Encounter: Payer: Self-pay | Admitting: Nurse Practitioner

## 2016-06-09 ENCOUNTER — Ambulatory Visit (INDEPENDENT_AMBULATORY_CARE_PROVIDER_SITE_OTHER): Payer: BLUE CROSS/BLUE SHIELD | Admitting: Psychology

## 2016-06-09 DIAGNOSIS — F900 Attention-deficit hyperactivity disorder, predominantly inattentive type: Secondary | ICD-10-CM

## 2016-06-09 DIAGNOSIS — F909 Attention-deficit hyperactivity disorder, unspecified type: Secondary | ICD-10-CM

## 2016-06-09 DIAGNOSIS — F419 Anxiety disorder, unspecified: Secondary | ICD-10-CM | POA: Diagnosis not present

## 2016-07-05 ENCOUNTER — Encounter: Payer: Self-pay | Admitting: Family Medicine

## 2016-07-05 ENCOUNTER — Ambulatory Visit (INDEPENDENT_AMBULATORY_CARE_PROVIDER_SITE_OTHER): Payer: BLUE CROSS/BLUE SHIELD | Admitting: Family Medicine

## 2016-07-05 VITALS — BP 124/60 | HR 87 | Temp 98.2°F | Ht 64.5 in | Wt 173.2 lb

## 2016-07-05 DIAGNOSIS — F909 Attention-deficit hyperactivity disorder, unspecified type: Secondary | ICD-10-CM | POA: Diagnosis not present

## 2016-07-05 DIAGNOSIS — E119 Type 2 diabetes mellitus without complications: Secondary | ICD-10-CM | POA: Diagnosis not present

## 2016-07-05 DIAGNOSIS — I10 Essential (primary) hypertension: Secondary | ICD-10-CM | POA: Diagnosis not present

## 2016-07-05 DIAGNOSIS — Z794 Long term (current) use of insulin: Secondary | ICD-10-CM

## 2016-07-05 LAB — HEMOGLOBIN A1C: Hgb A1c MFr Bld: 10.6 % — ABNORMAL HIGH (ref 4.6–6.5)

## 2016-07-05 NOTE — Assessment & Plan Note (Signed)
Established problem, unclear control. Likely uncontrolled. A1C today. Continue Lantus at current dose of 60 units at this time while awaiting A1C.

## 2016-07-05 NOTE — Progress Notes (Signed)
Pre visit review using our clinic review tool, if applicable. No additional management support is needed unless otherwise documented below in the visit note. 

## 2016-07-05 NOTE — Progress Notes (Signed)
Subjective:  Patient ID: Carol ReedyKimberly M Silman, female    DOB: 1981/06/04  Age: 35 y.o. MRN: [id number]  CC: Follow up  HPI:  35 year old with a history of tracheostomy, hypertension, DM 2, and concern for ADHD presents for follow-up.  DM  Blood sugars readings - Checking approximately once a week. Reports sugars of 170-180.  Hypoglycemia - No.  Medications - Lantus 60 units at night.  Adverse effects - None.  Compliance - Yes.  Preventative care  Eye exam - Had exam 4 months ago.  Foot exam - In need of.  Last A1C - In need of.  Urine microalbumin - On ARB.  HTN  Well controlled on Azor.  ? ADHD  Patient reports that she was evaluated by Dr. Reggy EyeAltabet who confirmed diagnosis of ADHD.  We have yet to receive records of this.  Patient states that her symptoms are incredibly troublesome. She's having difficulty concentrating and staying on task at home and at work. This is interfering greatly.  She would like to discuss this today.    Social Hx   Social History   Social History  . Marital Status: Married    Spouse Name: N/A  . Number of Children: N/A  . Years of Education: N/A   Social History Main Topics  . Smoking status: Former Smoker    Quit date: 02/17/2002  . Smokeless tobacco: Never Used  . Alcohol Use: 0.0 oz/week    0 Standard drinks or equivalent per week     Comment: Rare  . Drug Use: No  . Sexual Activity:    Partners: Male   Other Topics Concern  . None   Social History Narrative   Married   Employed as a Manufacturing engineersupervisor   Bachelor's degree    Caffeine- Hot tea and coffee- 1 cup    No children    Review of Systems  Endocrine: Negative.   Psychiatric/Behavioral: Positive for decreased concentration.    Objective:  BP 124/60 mmHg  Pulse 87  Temp(Src) 98.2 F (36.8 C) (Oral)  Ht 5' 4.5" (1.638 m)  Wt 173 lb 3.2 oz (78.563 kg)  BMI 29.28 kg/m2  SpO2 98%  LMP 07/03/2016  BP/Weight 07/05/2016 04/03/2016 05/14/2015  Systolic BP 124  469149 117  Diastolic BP 60 91 79  Wt. (Lbs) 173.2 171.25 185  BMI 29.28 28.95 31.74   Physical Exam  Constitutional: She is oriented to person, place, and time. She appears well-developed. No distress.  Cardiovascular: Normal rate and regular rhythm.   No murmur heard. Pulmonary/Chest: Effort normal. She has no wheezes. She has no rales.  Neurological: She is alert and oriented to person, place, and time.  Psychiatric: She has a normal mood and affect.  Vitals reviewed.   Lab Results  Component Value Date   WBC 6.6 05/14/2015   HGB 11.6* 05/14/2015   HCT 36.3 05/14/2015   PLT 297 05/14/2015   GLUCOSE 221* 05/14/2015   NA 136 05/14/2015   K 3.8 05/14/2015   CL 102 05/14/2015   CREATININE 0.61 05/14/2015   BUN 12 05/14/2015   CO2 29 05/14/2015   Assessment & Plan:   Problem List Items Addressed This Visit    Benign essential HTN    Well controlled. Continue Azor.      DM type 2 (diabetes mellitus, type 2) (HCC) - Primary    Established problem, unclear control. Likely uncontrolled. A1C today. Continue Lantus at current dose of 60 units at this time while awaiting  A1C.      Relevant Orders   HgB A1c   ADHD (attention deficit hyperactivity disorder)    New diagnosis. Awaiting records. When they return to confirm, will start Adderall XR.        Follow-up: TBD after A1C returns.  Everlene Other DO [facility]

## 2016-07-05 NOTE — Assessment & Plan Note (Signed)
Well controlled. Continue Azor.

## 2016-07-05 NOTE — Assessment & Plan Note (Signed)
New diagnosis. Awaiting records. When they return to confirm, will start Adderall XR.

## 2016-07-05 NOTE — Patient Instructions (Signed)
Continue your current medications.  I will call you when I receive the records from [facility] (I left a message).  Take care  Dr. Adriana Simasook

## 2016-07-07 ENCOUNTER — Encounter: Payer: Self-pay | Admitting: Family Medicine

## 2016-07-07 NOTE — Telephone Encounter (Signed)
Was this referral placed?

## 2016-07-10 ENCOUNTER — Ambulatory Visit: Payer: BLUE CROSS/BLUE SHIELD | Admitting: Family Medicine

## 2016-07-10 ENCOUNTER — Ambulatory Visit: Payer: BLUE CROSS/BLUE SHIELD | Admitting: Nurse Practitioner

## 2016-07-11 ENCOUNTER — Other Ambulatory Visit: Payer: Self-pay | Admitting: Family Medicine

## 2016-07-11 MED ORDER — AMPHETAMINE-DEXTROAMPHET ER 20 MG PO CP24: 20.0000 mg | ORAL_CAPSULE | Freq: Every day | ORAL | 0 refills | Status: DC

## 2016-07-11 NOTE — Telephone Encounter (Signed)
Pt came in to pick up her Adderall rx  But nothing was placed up front.. Please advise

## 2016-08-09 ENCOUNTER — Other Ambulatory Visit: Payer: Self-pay | Admitting: Family Medicine

## 2016-08-09 NOTE — Telephone Encounter (Signed)
Refill request for Adderall, last [id number], last filled [id number].  Please advise.

## 2016-08-09 NOTE — Telephone Encounter (Signed)
Pt called about needing a refill for amphetamine-dextroamphetamine (ADDERALL XR) 20 MG 24 hr capsule. Pt does have a appt scheduled on 09/18.   Pharmacy is [facility] [phone] - [place], [place] - [address] AT [address] OF [address]  Call pt @ [phone]. Thank you!

## 2016-08-10 ENCOUNTER — Other Ambulatory Visit: Payer: Self-pay | Admitting: Family Medicine

## 2016-08-10 MED ORDER — AMPHETAMINE-DEXTROAMPHET ER 20 MG PO CP24
20.0000 mg | ORAL_CAPSULE | Freq: Every day | ORAL | 0 refills | Status: DC
Start: 2016-08-10 — End: 2016-09-04

## 2016-08-11 ENCOUNTER — Telehealth: Payer: Self-pay

## 2016-08-11 NOTE — Telephone Encounter (Signed)
Left message to return call back.  [facility]

## 2016-08-11 NOTE — Telephone Encounter (Signed)
Refilled and placed on Ashleigh's desk.

## 2016-08-11 NOTE — Telephone Encounter (Signed)
Faxed by [facility].

## 2016-09-04 ENCOUNTER — Encounter: Payer: Self-pay | Admitting: Family Medicine

## 2016-09-04 ENCOUNTER — Ambulatory Visit (INDEPENDENT_AMBULATORY_CARE_PROVIDER_SITE_OTHER): Payer: BLUE CROSS/BLUE SHIELD | Admitting: Family Medicine

## 2016-09-04 DIAGNOSIS — F909 Attention-deficit hyperactivity disorder, unspecified type: Secondary | ICD-10-CM

## 2016-09-04 DIAGNOSIS — E119 Type 2 diabetes mellitus without complications: Secondary | ICD-10-CM

## 2016-09-04 DIAGNOSIS — Z794 Long term (current) use of insulin: Secondary | ICD-10-CM

## 2016-09-04 DIAGNOSIS — I10 Essential (primary) hypertension: Secondary | ICD-10-CM | POA: Diagnosis not present

## 2016-09-04 MED ORDER — AMPHETAMINE-DEXTROAMPHET ER 20 MG PO CP24: 20.0000 mg | ORAL_CAPSULE | Freq: Every day | ORAL | 0 refills | Status: DC

## 2016-09-04 MED ORDER — AMPHETAMINE-DEXTROAMPHET ER 20 MG PO CP24
20.0000 mg | ORAL_CAPSULE | Freq: Every day | ORAL | 0 refills | Status: DC
Start: 2016-09-04 — End: 2016-12-06

## 2016-09-04 NOTE — Progress Notes (Signed)
Pre visit review using our clinic review tool, if applicable. No additional management support is needed unless otherwise documented below in the visit note. 

## 2016-09-04 NOTE — Assessment & Plan Note (Signed)
Stable/doing well.  Adderall refill 3 months. [place] control substance database reviewed.

## 2016-09-04 NOTE — Patient Instructions (Signed)
Continue the adderall.  Take the insulin in the am.  Follow up in 3 months.  Take care  Dr. Adriana Simasook

## 2016-09-04 NOTE — Assessment & Plan Note (Signed)
Stable. Continue Azor.

## 2016-09-04 NOTE — Progress Notes (Signed)
Subjective:  Patient ID: Carol [name], female    DOB: 1981/03/01  Age: 35 y.o. MRN: [id number]  CC: Follow up  HPI:  35 year old female with subglottic stenosis status post trach, hypertension, DM 2, ADHD presents for follow-up.  ADHD  Doing very well at this time.  She reports an improved ability to focus. She is doing well at work.  She is in need of refill on her Adderall.  HTN  Well controlled on Azor.  No reported side effects.  Tolerating without difficulty.  DM-2  Uncontrolled. Secondary to noncompliance.  Patient states that she often forgets to take her insulin. She states that she takes her insulin approximately 3 times/week.  Not checking sugars regulate. Neck slight she does state that after she takes her insulin her fasting blood sugar is in the 100s.  Will discuss today.  Social Hx   Social History   Social History  . Marital status: Married    Spouse name: N/A  . Number of children: N/A  . Years of education: N/A   Social History Main Topics  . Smoking status: Former Smoker    Quit date: 02/17/2002  . Smokeless tobacco: Never Used  . Alcohol use 0.0 oz/week     Comment: Rare  . Drug use: No  . Sexual activity: Yes    Partners: Male   Other Topics Concern  . None   Social History Narrative   Married   Employed as a Manufacturing engineersupervisor   Bachelor's degree    Caffeine- Hot tea and coffee- 1 cup    No children     Review of Systems  Constitutional: Negative.   Psychiatric/Behavioral:       Improved concentration/focus.   Objective:  BP 114/82 (BP Location: Left Arm, Patient Position: Sitting, Cuff Size: Normal)   Pulse (!) 102   Temp 98.2 F (36.8 C) (Oral)   Wt 166 lb 4 oz (75.4 kg)   SpO2 98%   BMI 28.10 kg/m   BP/Weight 09/04/2016 07/05/2016 04/03/2016  Systolic BP 114 124 149  Diastolic BP 82 60 91  Wt. (Lbs) 166.25 173.2 171.25  BMI 28.1 29.28 28.95   Physical Exam  Constitutional: She is oriented to person, place, and  time. She appears well-developed. No distress.  Cardiovascular: Normal rate and regular rhythm.   Pulmonary/Chest: Effort normal. She has no wheezes. She has no rales.  Neurological: She is alert and oriented to person, place, and time.  Psychiatric: She has a normal mood and affect.  Vitals reviewed.   Lab Results  Component Value Date   WBC 6.6 05/14/2015   HGB 11.6 (L) 05/14/2015   HCT 36.3 05/14/2015   PLT 297 05/14/2015   GLUCOSE 221 (H) 05/14/2015   NA 136 05/14/2015   K 3.8 05/14/2015   CL 102 05/14/2015   CREATININE 0.61 05/14/2015   BUN 12 05/14/2015   CO2 29 05/14/2015   HGBA1C 10.6 (H) 07/05/2016    Assessment & Plan:   Problem List Items Addressed This Visit    Benign essential HTN    Stable. Continue Azor.      DM type 2 (diabetes mellitus, type 2) (HCC)    Uncontrolled. Advised compliance with insulin.  Advised to try taking in the am (this is easier for her to remember).      ADHD (attention deficit hyperactivity disorder)    Stable/doing well.  Adderall refill 3 months. [place] control substance database reviewed.       Other Visit  Diagnoses   None.     Meds ordered this encounter  Medications  . amphetamine-dextroamphetamine (ADDERALL XR) 20 MG 24 hr capsule    Sig: Take 1 capsule (20 mg total) by mouth daily.    Dispense:  30 capsule    Refill:  0    Fill on or after 9/28.  Marland Kitchen amphetamine-dextroamphetamine (ADDERALL XR) 20 MG 24 hr capsule    Sig: Take 1 capsule (20 mg total) by mouth daily.    Dispense:  30 capsule    Refill:  0    Fill on or after 10/28.  Marland Kitchen amphetamine-dextroamphetamine (ADDERALL XR) 20 MG 24 hr capsule    Sig: Take 1 capsule (20 mg total) by mouth daily.    Dispense:  30 capsule    Refill:  0    Fill on or after 11/28.    Follow-up: Return in about 3 months (around 12/04/2016) for Diabetes follow up.  Everlene Other DO [facility]

## 2016-09-04 NOTE — Assessment & Plan Note (Signed)
Uncontrolled. Advised compliance with insulin.  Advised to try taking in the am (this is easier for her to remember).

## 2016-12-06 ENCOUNTER — Encounter: Payer: Self-pay | Admitting: Family Medicine

## 2016-12-06 ENCOUNTER — Ambulatory Visit (INDEPENDENT_AMBULATORY_CARE_PROVIDER_SITE_OTHER): Payer: BLUE CROSS/BLUE SHIELD | Admitting: Family Medicine

## 2016-12-06 VITALS — BP 146/84 | HR 107 | Temp 98.5°F | Resp 12 | Wt 164.2 lb

## 2016-12-06 DIAGNOSIS — Z794 Long term (current) use of insulin: Secondary | ICD-10-CM | POA: Diagnosis not present

## 2016-12-06 DIAGNOSIS — F909 Attention-deficit hyperactivity disorder, unspecified type: Secondary | ICD-10-CM | POA: Diagnosis not present

## 2016-12-06 DIAGNOSIS — I10 Essential (primary) hypertension: Secondary | ICD-10-CM

## 2016-12-06 DIAGNOSIS — Z862 Personal history of diseases of the blood and blood-forming organs and certain disorders involving the immune mechanism: Secondary | ICD-10-CM

## 2016-12-06 DIAGNOSIS — E119 Type 2 diabetes mellitus without complications: Secondary | ICD-10-CM

## 2016-12-06 LAB — COMPREHENSIVE METABOLIC PANEL
ALT: 9 U/L (ref 0–35)
AST: 10 U/L (ref 0–37)
Albumin: 4.2 g/dL (ref 3.5–5.2)
Alkaline Phosphatase: 67 U/L (ref 39–117)
BUN: 11 mg/dL (ref 6–23)
CO2: 30 meq/L (ref 19–32)
Calcium: 9.4 mg/dL (ref 8.4–10.5)
Chloride: 99 mEq/L (ref 96–112)
Creatinine, Ser: 0.72 mg/dL (ref 0.40–1.20)
GFR: 116.95 mL/min (ref >60.00)
GLUCOSE: 257 mg/dL — AB (ref 70–99)
POTASSIUM: 4 meq/L (ref 3.5–5.1)
Sodium: 136 mEq/L (ref 135–145)
Total Bilirubin: 0.5 mg/dL (ref 0.2–1.2)
Total Protein: 7.7 g/dL (ref 6.0–8.3)

## 2016-12-06 LAB — HEMOGLOBIN A1C: HEMOGLOBIN A1C: 8.9 % — AB (ref 4.6–6.5)

## 2016-12-06 LAB — LIPID PANEL
CHOL/HDL RATIO: 4
Cholesterol: 207 mg/dL — ABNORMAL HIGH (ref 0–200)
HDL: 57.6 mg/dL (ref >39.00)
LDL CALC: 139 mg/dL — AB (ref 0–99)
NONHDL: 149.63
Triglycerides: 53 mg/dL (ref 0.0–149.0)
VLDL: 10.6 mg/dL (ref 0.0–40.0)

## 2016-12-06 LAB — CBC
HEMATOCRIT: 36.6 % (ref 36.0–46.0)
Hemoglobin: 12 g/dL (ref 12.0–15.0)
MCHC: 32.8 g/dL (ref 30.0–36.0)
MCV: 81 fl (ref 78.0–100.0)
Platelets: 336 10*3/uL (ref 150.0–400.0)
RBC: 4.52 Mil/uL (ref 3.87–5.11)
RDW: 12.9 % (ref 11.5–15.5)
WBC: 4.8 10*3/uL (ref 4.0–10.5)

## 2016-12-06 MED ORDER — AMPHETAMINE-DEXTROAMPHET ER 20 MG PO CP24: 20.0000 mg | ORAL_CAPSULE | Freq: Every day | ORAL | 0 refills | Status: DC

## 2016-12-06 MED ORDER — AMLODIPINE-OLMESARTAN 10-20 MG PO TABS: 1.0000 | ORAL_TABLET | Freq: Every day | ORAL | 3 refills | Status: DC

## 2016-12-06 MED ORDER — AMPHETAMINE-DEXTROAMPHET ER 20 MG PO CP24
20.0000 mg | ORAL_CAPSULE | Freq: Every day | ORAL | 0 refills | Status: DC
Start: 2016-12-06 — End: 2017-05-21

## 2016-12-06 NOTE — Assessment & Plan Note (Signed)
Stable. Continue Adderall. Refilled today. 

## 2016-12-06 NOTE — Progress Notes (Signed)
Pre visit review using our clinic review tool, if applicable. No additional management support is needed unless otherwise documented below in the visit note. 

## 2016-12-06 NOTE — Assessment & Plan Note (Addendum)
Not at goal secondary to recent absence of medication. Continue Azor. Refilled today.

## 2016-12-06 NOTE — Patient Instructions (Signed)
Continue your medications.  Follow up in 3 months.  We will call with your lab results.  Take care  Dr. Adriana Simasook

## 2016-12-06 NOTE — Assessment & Plan Note (Signed)
Improving. A1c today. Continue Lantus.

## 2016-12-06 NOTE — Progress Notes (Signed)
Subjective:  Patient ID: Carol [name], female    DOB: 11/19/1981  Age: 35 y.o. MRN: [id number]  CC: Follow up   HPI:  35 year old female with subglottic stenosis status post tracheostomy, DM 2, hypertension, ADHD presents for follow-up.  DM-2  Patient states that her sugars are improving.  He states that they are mostly in the 120s to 130s but she has had readings in the 230s as well.  She is compliant with Lantus 60 units daily. She states that she takes her insulin the majority of time but does forget on occasion.  Needs A1C today.  HTN  Not at goal.  Patient has been out of her medication recently.  In need of refill today.  ADHD  Stable Adderall. Needs refill.  Social Hx   Social History   Social History  . Marital status: Married    Spouse name: N/A  . Number of children: N/A  . Years of education: N/A   Social History Main Topics  . Smoking status: Former Smoker    Quit date: 02/17/2002  . Smokeless tobacco: Never Used  . Alcohol use 0.0 oz/week     Comment: Rare  . Drug use: No  . Sexual activity: Yes    Partners: Male   Other Topics Concern  . None   Social History Narrative   Married   Employed as a Manufacturing engineersupervisor   Bachelor's degree    Caffeine- Hot tea and coffee- 1 cup    No children    Review of Systems  Constitutional: Negative.   Respiratory: Negative.    Objective:  BP (!) 146/84 (BP Location: Left Arm, Patient Position: Sitting, Cuff Size: Normal)   Pulse (!) 107   Temp 98.5 F (36.9 C) (Oral)   Resp 12   Wt 164 lb 4 oz (74.5 kg)   SpO2 99%   BMI 27.76 kg/m   BP/Weight 12/06/2016 09/04/2016 07/05/2016  Systolic BP 146 114 124  Diastolic BP 84 82 60  Wt. (Lbs) 164.25 166.25 173.2  BMI 27.76 28.1 29.28   Physical Exam  Constitutional: She is oriented to person, place, and time. She appears well-developed. No distress.  Cardiovascular: Normal rate and regular rhythm.   Pulmonary/Chest: Effort normal and breath sounds  normal.  Neurological: She is alert and oriented to person, place, and time.  Psychiatric: She has a normal mood and affect.  Vitals reviewed.  Lab Results  Component Value Date   WBC 6.6 05/14/2015   HGB 11.6 (L) 05/14/2015   HCT 36.3 05/14/2015   PLT 297 05/14/2015   GLUCOSE 221 (H) 05/14/2015   NA 136 05/14/2015   K 3.8 05/14/2015   CL 102 05/14/2015   CREATININE 0.61 05/14/2015   BUN 12 05/14/2015   CO2 29 05/14/2015   HGBA1C 10.6 (H) 07/05/2016   Assessment & Plan:   Problem List Items Addressed This Visit    DM type 2 (diabetes mellitus, type 2) (HCC) - Primary    Improving. A1c today. Continue Lantus.      Relevant Medications   amlodipine-olmesartan (AZOR) 10-20 MG tablet   Other Relevant Orders   Hemoglobin A1c   Lipid panel   Benign essential HTN    Not at goal secondary to recent absence of medication. Continue Azor. Refilled today.      Relevant Medications   amlodipine-olmesartan (AZOR) 10-20 MG tablet   Other Relevant Orders   Comprehensive metabolic panel   ADHD (attention deficit hyperactivity disorder)    Stable.  Continue Adderall. Refilled today.        Other Visit Diagnoses    History of anemia       Relevant Orders   CBC     Meds ordered this encounter  Medications  . amphetamine-dextroamphetamine (ADDERALL XR) 20 MG 24 hr capsule    Sig: Take 1 capsule (20 mg total) by mouth daily.    Dispense:  30 capsule    Refill:  0    Fill on or after 01/14/17  . amlodipine-olmesartan (AZOR) 10-20 MG tablet    Sig: Take 1 tablet by mouth daily.    Dispense:  90 tablet    Refill:  3  . amphetamine-dextroamphetamine (ADDERALL XR) 20 MG 24 hr capsule    Sig: Take 1 capsule (20 mg total) by mouth daily.    Dispense:  30 capsule    Refill:  0    Fill on or after 12/28.  Marland Kitchen. amphetamine-dextroamphetamine (ADDERALL XR) 20 MG 24 hr capsule    Sig: Take 1 capsule (20 mg total) by mouth daily.    Dispense:  30 capsule    Refill:  0    Fill on or  after 02/14/17.    Follow-up: 3 months.   Everlene OtherJayce Yurika Pereda DO [facility] [place]

## 2016-12-25 ENCOUNTER — Encounter: Payer: Self-pay | Admitting: Pharmacist

## 2016-12-25 ENCOUNTER — Ambulatory Visit (INDEPENDENT_AMBULATORY_CARE_PROVIDER_SITE_OTHER): Payer: BLUE CROSS/BLUE SHIELD | Admitting: Pharmacist

## 2016-12-25 DIAGNOSIS — I10 Essential (primary) hypertension: Secondary | ICD-10-CM | POA: Diagnosis not present

## 2016-12-25 DIAGNOSIS — E119 Type 2 diabetes mellitus without complications: Secondary | ICD-10-CM | POA: Diagnosis not present

## 2016-12-25 DIAGNOSIS — Z794 Long term (current) use of insulin: Secondary | ICD-10-CM | POA: Diagnosis not present

## 2016-12-25 MED ORDER — ROSUVASTATIN CALCIUM 20 MG PO TABS: 20.0000 mg | ORAL_TABLET | Freq: Every day | ORAL | 3 refills | Status: DC

## 2016-12-25 MED ORDER — EMPAGLIFLOZIN 10 MG PO TABS: 10.0000 mg | ORAL_TABLET | Freq: Every day | ORAL | 1 refills | Status: DC

## 2016-12-25 MED ORDER — GLUCOSE BLOOD VI STRP: ORAL_STRIP | 12 refills | Status: AC

## 2016-12-25 NOTE — Progress Notes (Signed)
Care was provided under my supervision. I agree with the management as indicated in the note.  Alexandru Moorer DO  

## 2016-12-25 NOTE — Progress Notes (Signed)
S:    Patient arrives in good spirits ambulating without assistance.  Presents for diabetes/hyperlipidemia evaluation, education, and management at the request of Dr Adriana Simas. Patient was referred on 12/07/16 (following lab results).  Patient was last seen by Primary Care Provider on 12/06/2016 by Dr Adriana Simas.   Patient reports Diabetes was diagnosed in 2009 and started on insulin in 2012.    Patient reports adherence with medications but has not been able to pick up Azor (amlodipine/olmesartan) due to cost issues. She does state she sometimes misses her insulin due to forgetting and she takes it to work with her.  Current diabetes medications include: Lantus 60 units daily  Current hypertension medications include: amlodipine 10 mg/olmesartan 20 mg daily  Previously on metformin but unable to tolerate due to nausea.    Patient reported dietary habits: Eats 3 meals/day.  Pescetarian (mostly baked or grilled)  When working may be eating more carbohydrates.  Eats a lot of vegetables.  Does have sweets ~2x/day (pastries/pies).  Drinks coffee with cream and artificial sweetener and water, unsweet tea.    Patient reported exercise habits: occasionally.  Has not worked out in 3 weeks.    Patient denies hypoglycemic events.  Patient reports nocturia occasionally but not very often.  Patient denies neuropathy. Patient denies visual changes. Reports eye exam in 09/2016.   Patient denies self foot exams.  Has never had diagnosis of UTIs but has had a history of vaginal irritation but is unsure of the cause.    Has not been checking CBGs and states she is out of test strips.     O:  Lab Results  Component Value Date   HGBA1C 8.9 (H) 12/06/2016   Vitals:   12/25/16 0809  BP: (!) 168/129  Pulse: 88   10 year ASCVD risk: 13.6% (based on age of 36 years old as calculator only calculates based on age 81-79  A/P: Diabetes longstanding diagnosed currently uncontrolled with last A1C of 8.9%. Patient  denies hypoglycemic events and is able to verbalize appropriate hypoglycemia management plan. Patient reports adherence with medication most of the time. Control is suboptimal due to lack of prandial blood glucose control/nonadherence.  Following discussion and approval by Dr Adriana Simas, the following medication changes were made:  Continued basal insulin Lantus (insulin glargine) 60 units daily. Started Jardiance (empagliflozin) 10 mg daily. Discussed efficacy, side effects including yeast infections/UTIs and instructed patient to stay well hydrated while on this medication.  Have refilled blood glucose test strips and instructed her to check once daily alternating between fasting, after lunch, and after dinner.  Provided patient with activated copay cards for Lantus and Jardiance Discussed importance of checking her feet daily and obtaining yearly eye exams.   Instructed patient to put reminder alarm in her phone so she remembers to take her medications.  Next A1C anticipated 2 months.    ASCVD risk greater than 7.5% with LDL above goal of <100 mg/dL at 161 mg/dL.  Started Rosuvastatin 20 mg once daily. Discussed efficacy and side effects including muscle pain/myalgias.     Hypertension longstanding diagnosed currently uncontrolled.  Patient denies adherence with medication due to cost. Control is suboptimal due to nonadherence .Patient will call clinic today if copay for amlodipine/olmesartan is still expensive after the new year.  If copay is still expensive will switch to losartan 50 mg daily and amlodipine 10 mg daily.   Written patient instructions provided.  Total time in face to face counseling 50 minutes.  Follow up in Pharmacist Clinic Visit in 1 month.    Patient was seen with Dr Adriana Simasook today in clinic and medication changes were discussed and approved prior to initiation  Hazle NordmannKelsy Combs, PharmD, BCPS [facility] PGY2 Pharmacy Resident [phone]

## 2016-12-25 NOTE — Assessment & Plan Note (Signed)
Hypertension longstanding diagnosed currently uncontrolled.  Patient denies adherence with medication due to cost. Control is suboptimal due to nonadherence .Patient will call clinic today if copay for amlodipine/olmesartan is still expensive after the new year.  If copay is still expensive will switch to losartan 50 mg daily and amlodipine 10 mg daily.

## 2016-12-25 NOTE — Patient Instructions (Addendum)
Start Jardiance 10 mg once daily Start Crestor (rosuvastatin) 20 mg once daily  Continue Lantus 60 units once daily  Please set the reminder in your phone to remember your medications Please check your blood glucose at least once daily   Followup with Hazle NordmannKelsy Estill Llerena Pharmacist in 1 month.

## 2016-12-25 NOTE — Assessment & Plan Note (Signed)
Diabetes longstanding diagnosed currently uncontrolled with last A1C of 8.9%. Patient denies hypoglycemic events and is able to verbalize appropriate hypoglycemia management plan. Patient reports adherence with medication most of the time. Control is suboptimal due to lack of prandial blood glucose control/nonadherence.  Following discussion and approval by Dr Adriana Simasook, the following medication changes were made:  Continued basal insulin Lantus (insulin glargine) 60 units daily. Started Jardiance (empagliflozin) 10 mg daily. Discussed efficacy, side effects including yeast infections/UTIs and instructed patient to stay well hydrated while on this medication.  Have refilled blood glucose test strips and instructed her to check once daily alternating between fasting, after lunch, and after dinner.  Provided patient with activated copay cards for Lantus and Jardiance Discussed importance of checking her feet daily and obtaining yearly eye exams.   Instructed patient to put reminder alarm in her phone so she remembers to take her medications.  Next A1C anticipated 2 months.    ASCVD risk greater than 7.5% with LDL above goal of <100 mg/dL at 086139 mg/dL.  Started Rosuvastatin 20 mg once daily. Discussed efficacy and side effects including muscle pain/myalgias.

## 2016-12-27 ENCOUNTER — Telehealth: Payer: Self-pay | Admitting: Family Medicine

## 2016-12-27 ENCOUNTER — Other Ambulatory Visit: Payer: Self-pay | Admitting: Family Medicine

## 2016-12-27 MED ORDER — BASAGLAR KWIKPEN 100 UNIT/ML ~~LOC~~ SOPN: 60.0000 [IU] | PEN_INJECTOR | Freq: Every day | SUBCUTANEOUS | 3 refills | Status: DC

## 2016-12-27 NOTE — Telephone Encounter (Signed)
PA completed on cover my meds for Jardiance, Insurance e may require to try [place] or FarXiga  First. Insurance response should come back with in 24 hours. FYI

## 2016-12-27 NOTE — Telephone Encounter (Signed)
Switching to [place] [employer].

## 2016-12-27 NOTE — Telephone Encounter (Signed)
Pa for Lantus solostar pen started but insurance is requiring change to either levemir flex touch or Basaglar Kwik Pen be tried and if patient cannot tolerate we try for lantus approval.

## 2016-12-28 NOTE — Telephone Encounter (Signed)
LVTCB in regards to new insulin at pharmacy.

## 2017-01-01 ENCOUNTER — Telehealth: Payer: Self-pay | Admitting: Family Medicine

## 2017-01-01 NOTE — Telephone Encounter (Signed)
Is patient okay with this?

## 2017-01-01 NOTE — Telephone Encounter (Signed)
We received pts PA denial. Insurance wants her to try invokana or farxiga before trying jardiance. Please re-prescribe one of these two medications.

## 2017-01-01 NOTE — Telephone Encounter (Signed)
Pt stated that she got jardiance for $10 so she was told to continue on jardiance. If she had anything change with her  insurance to call us

## 2017-01-22 ENCOUNTER — Telehealth: Payer: Self-pay | Admitting: *Deleted

## 2017-01-22 NOTE — Telephone Encounter (Signed)
LVTCB

## 2017-01-22 NOTE — Telephone Encounter (Signed)
Carol [name] has requested a call to clarify if pt has taken another Rx prior to being prescribed Jardiance. Contact [phone]

## 2017-01-22 NOTE — Telephone Encounter (Signed)
Information was given the Doctor stated that farxiga was better for pt.

## 2017-01-22 NOTE — Telephone Encounter (Signed)
I chose this medication given is proven data regarding cardiovascular disease. Invokana has been associated with higher incidence of amputation. I do not use farxiga often given the fact that it cannot be used with a creatinine <60.

## 2017-01-22 NOTE — Telephone Encounter (Signed)
Please call back in regards to pt PA for jardiance. They want to switch her to invokana or farxiga.

## 2017-01-29 ENCOUNTER — Ambulatory Visit (INDEPENDENT_AMBULATORY_CARE_PROVIDER_SITE_OTHER): Payer: BLUE CROSS/BLUE SHIELD | Admitting: Pharmacist

## 2017-01-29 ENCOUNTER — Encounter: Payer: Self-pay | Admitting: Pharmacist

## 2017-01-29 DIAGNOSIS — E119 Type 2 diabetes mellitus without complications: Secondary | ICD-10-CM

## 2017-01-29 DIAGNOSIS — I10 Essential (primary) hypertension: Secondary | ICD-10-CM

## 2017-01-29 DIAGNOSIS — Z794 Long term (current) use of insulin: Secondary | ICD-10-CM | POA: Diagnosis not present

## 2017-01-29 MED ORDER — BASAGLAR KWIKPEN 100 UNIT/ML ~~LOC~~ SOPN: 50.0000 [IU] | PEN_INJECTOR | Freq: Every day | SUBCUTANEOUS | 3 refills | Status: DC

## 2017-01-29 NOTE — Progress Notes (Signed)
Care was provided under my supervision. I agree with the management as indicated in the note.  Nachmen Mansel DO  

## 2017-01-29 NOTE — Progress Notes (Signed)
    S:    Patient arrives in good spirits ambulating without assistance.  Presents for diabetes evaluation, education, and management at the request of Dr Adriana Simasook. Patient was referred on 12/06/16.  Patient was last seen by Primary Care Provider on 12/06/16 and last seen in pharmacy clinic on 12/25/16.  Patient reports today that she has made some diet and exercise changes and is excited how her blood glucose numbers have been running.    Patient reports adherence with medications.  Current diabetes medications include: Basaglar 60 units daily, Jardiance 10 mg daily  Current hypertension medications include: amlodipine/olmesartan 10/20 mg daily. Had been off of BP medications for 1 month prior to last visit.   Patient reports hypoglycemic events a couple times per week.  Reports proper treatment with juice.  She reports she woke up during the night with symptoms of sweating.    Patient reported dietary habits: Is a Pescetarian.  Has stopped getting specialty coffee.    Patient reported exercise habits: Increased to Gym 2 times per week.    Patient denies nocturia.  Denies pain/burning when urinating.  Reports she has been drinking lots of water.   Patient denies neuropathy. Patient denies visual changes. Patient reports self foot exams.    O:  Lab Results  Component Value Date   HGBA1C 8.9 (H) 12/06/2016   Vitals:   01/29/17 0836  BP: 117/80  Pulse: 89    Home fasting CBG: 56, 54, 249, 120, 123, 120, 147, 111, 82, 79, 55, 79, 92, 69, 212, 193  2 hour post-prandial/random CBG: 81, 60, 122, 100, 73, 70, 112, 53, 99, 81, 134, 193, 187, 128, 117, 135, 70  10 year ASCVD risk: 13.6% (based on age of 38 years old as calculator only calculates based on age 38-79.  A/P: Diabetes longstanding diagnosed currently uncontrolled but CBGs have improved since adding Jardiance at last visit. Patient reports hypoglycemic events and is able to verbalize appropriate hypoglycemia management plan.  Patient reports adherence with medication. Control is suboptimal due to previously sedentary lifestyle and prandial blood glucose control.  Following discussion and approval by Dr Adriana Simasook, the following medication changes were made:  -Decreased Basaglar to 50 units daily due to patient having hypoglycemic episodes.   Instructed patient to call clinic if she continues to experience hypoglycemia CBG < 70 mg/dL. -Continued Jardiance (empagliflozin) 10 mg daily.  Have received notification that [place] Pepper[place]ardiance may not be covered by her insurance.  Asked her to refill today and let us know if we need to switch to Invokana.  -Discussed importance of staying well hydrated while on Jardiance.  -Encouraged patient to continue diet and exercise changes and to continue to check blood glucose alternating several times per day.  -Counseled on signs/symptoms/treatment of hypoglycemia -Next A1C anticipated 02/2017.    ASCVD risk greater than 7.5% Last LDL above goal <100 at 139 mg/dL.   Continued rosuvastatin 20 mg. Patient denies s/sx of myalgias since starting rosuvastatin at last visit.  Patient not currently on aspirin due to her age.   Hypertension longstanding diagnosed currently at goal after restarting amlodipine/olmesartan.  Patient reports adherence with medication. Continue current medication.   Written patient instructions provided.  Total time in face to face counseling 45 minutes.   Follow up in Pharmacist Clinic Visit in 6 weeks.     Patient was seen with Dr Adriana Simasook today in clinic and medication changes were discussed and approved prior to initiation

## 2017-01-29 NOTE — Progress Notes (Signed)
Care was provided under my supervision. I agree with the management as indicated in the note.  Basheer Molchan DO  

## 2017-01-29 NOTE — Patient Instructions (Addendum)
Decrease Basaglar to 50 units once daily  Continue Jardiance 10 mg daily (let us know if your insurance will not cover this)  Please call clinic if you have frequent low blood glucose (<70 more than 2 times per week)  Followup with Hazle NordmannKelsy Jailin Moomaw, PharmD in 6 weeks

## 2017-01-29 NOTE — Assessment & Plan Note (Signed)
Diabetes longstanding diagnosed currently uncontrolled but CBGs have improved since adding Jardiance at last visit. Patient reports hypoglycemic events and is able to verbalize appropriate hypoglycemia management plan. Patient reports adherence with medication. Control is suboptimal due to previously sedentary lifestyle and prandial blood glucose control.  Following discussion and approval by Dr Adriana Simasook, the following medication changes were made:  -Decreased Basaglar to 50 units daily due to patient having hypoglycemic episodes.   Instructed patient to call clinic if she continues to experience hypoglycemia CBG < 70 mg/dL. -Continued Jardiance (empagliflozin) 10 mg daily.  Have received notification that [place] Pepper[place]ardiance may not be covered by her insurance.  Asked her to refill today and let us know if we need to switch to Invokana.  -Encouraged patient to continue diet and exercise changes and to continue to check blood glucose alternating several times per day.  -Counseled on signs/symptoms/treatment of hypoglycemia -Next A1C anticipated 02/2017.

## 2017-01-29 NOTE — Assessment & Plan Note (Signed)
Hypertension longstanding diagnosed currently at goal after restarting amlodipine/olmesartan.  Patient reports adherence with medication. Continue current medication.

## 2017-02-27 ENCOUNTER — Ambulatory Visit (INDEPENDENT_AMBULATORY_CARE_PROVIDER_SITE_OTHER): Payer: BLUE CROSS/BLUE SHIELD | Admitting: Family Medicine

## 2017-02-27 ENCOUNTER — Encounter: Payer: Self-pay | Admitting: Family Medicine

## 2017-02-27 VITALS — BP 124/84 | HR 91 | Temp 97.8°F | Resp 10 | Ht 64.0 in | Wt 160.2 lb

## 2017-02-27 DIAGNOSIS — N63 Unspecified lump in unspecified breast: Secondary | ICD-10-CM

## 2017-02-27 DIAGNOSIS — R002 Palpitations: Secondary | ICD-10-CM | POA: Diagnosis not present

## 2017-02-27 NOTE — Patient Instructions (Signed)
We will call regarding your mammogram and ultrasound.  Let me know if the palpitations persist.  Take care  Dr. Adriana Simasook

## 2017-02-27 NOTE — Assessment & Plan Note (Signed)
New problem. Uncertain etiology/prognosis this time. Arranging mammogram and US.

## 2017-02-27 NOTE — Progress Notes (Signed)
Ok. I'm waiting for [place] to open vm is coming on at their office. I'm working on it.

## 2017-02-27 NOTE — Addendum Note (Signed)
Addended by: Jennelle HumanNEWTON, ASHLEIGH E on: 02/27/2017 01:37 PM   Modules accepted: Orders

## 2017-02-27 NOTE — Progress Notes (Signed)
Pre-visit discussion using our clinic review tool. No additional management support is needed unless otherwise documented below in the visit note.  

## 2017-02-27 NOTE — Progress Notes (Signed)
Subjective:  Patient ID: Carol [name], female    DOB: 02-20-1981  Age: 36 y.o. MRN: [id number]  CC: Lump (chest/breast), Heart fluttering  HPI:  36 year old female with hypertension, DM 2, subglottic stenosis, ADHD presents with the above complaints.  Patient reports that on Friday she was lying in bed and felt a lump in her left chest/breasts. Nontender. Not particularly bothersome. She states that she feels it has decreased in size since that time. No reported family history of breast cancer.  Subsequently after, she developed "fluttering" fluttering in her chest. She denies tachycardia. She states that it felt as if her heart was not beating normally. She has had this several times since Friday. Last for seconds and then resolve spontaneously. She reports caffeine intake and lack of sleep recently. No associated chest pain. No shortness of breath. No known relieving factors. No other complaints or concerns at this time.  Social Hx   Social History   Social History  . Marital status: Married    Spouse name: N/A  . Number of children: N/A  . Years of education: N/A   Social History Main Topics  . Smoking status: Former Smoker    Quit date: 02/17/2002  . Smokeless tobacco: Never Used  . Alcohol use 0.0 oz/week     Comment: Rare  . Drug use: No  . Sexual activity: Yes    Partners: Male   Other Topics Concern  . None   Social History Narrative   Married   Employed as a Manufacturing engineersupervisor   Bachelor's degree    Caffeine- Hot tea and coffee- 1 cup    No children     Review of Systems  Constitutional: Negative.   Respiratory: Negative for shortness of breath.   Cardiovascular: Negative for chest pain.       "fluttering".  Musculoskeletal:       Left scapula pain.   Objective:  BP 124/84   Pulse 91   Temp 97.8 F (36.6 C) (Oral)   Resp 10   Ht 5\' 4"  (1.626 m)   Wt 160 lb 4 oz (72.7 kg)   LMP 02/20/2017 (Approximate)   SpO2 98%   BMI 27.51 kg/m   BP/Weight  02/27/2017 01/29/2017 12/25/2016  Systolic BP 124 117 168  Diastolic BP 84 80 129  Wt. (Lbs) 160.25 159 162.9  BMI 27.51 26.87 27.53   Physical Exam  Constitutional: She is oriented to person, place, and time. She appears well-developed. No distress.  Cardiovascular: Normal rate and regular rhythm.   Pulmonary/Chest: Effort normal and breath sounds normal.  Neurological: She is alert and oriented to person, place, and time.  Psychiatric: She has a normal mood and affect.  Vitals reviewed. Breast - Left breast: Palpable firm nodule noted at the 10 o'clock position 10 cm from the nipple.  Lab Results  Component Value Date   WBC 4.8 12/06/2016   HGB 12.0 12/06/2016   HCT 36.6 12/06/2016   PLT 336.0 12/06/2016   GLUCOSE 257 (H) 12/06/2016   CHOL 207 (H) 12/06/2016   TRIG 53.0 12/06/2016   HDL 57.60 12/06/2016   LDLCALC 139 (H) 12/06/2016   ALT 9 12/06/2016   AST 10 12/06/2016   NA 136 12/06/2016   K 4.0 12/06/2016   CL 99 12/06/2016   CREATININE 0.72 12/06/2016   BUN 11 12/06/2016   CO2 30 12/06/2016   HGBA1C 8.9 (H) 12/06/2016    Assessment & Plan:   Problem List Items Addressed This Visit  Fluttering sensation of heart - Primary    New problem. EKG normal today. Suspect PVC's/PAC's. Discussed watchful waiting vs cardiology referral. Patient elected to wait.      Relevant Orders   EKG 12-Lead (Completed)   Breast lump in upper inner quadrant    New problem. Uncertain etiology/prognosis this time. Arranging mammogram and [place].       Relevant Orders   MM Digital Diagnostic Unilat L   US BREAST COMPLETE UNI LEFT INC AXILLA     Follow-up: PRN  Everlene Other DO [facility]

## 2017-02-27 NOTE — Assessment & Plan Note (Signed)
New problem. EKG normal today. Suspect PVC's/PAC's. Discussed watchful waiting vs cardiology referral. Patient elected to wait.

## 2017-03-08 ENCOUNTER — Ambulatory Visit
Admission: RE | Admit: 2017-03-08 | Discharge: 2017-03-08 | Disposition: A | Discharge status: Home / Self Care | Payer: BLUE CROSS/BLUE SHIELD | Source: Ambulatory Visit | Attending: Family Medicine | Admitting: Family Medicine

## 2017-03-08 DIAGNOSIS — N63 Unspecified lump in unspecified breast: Secondary | ICD-10-CM

## 2017-03-08 DIAGNOSIS — N6322 Unspecified lump in the left breast, upper inner quadrant: Secondary | ICD-10-CM | POA: Diagnosis not present

## 2017-03-12 ENCOUNTER — Ambulatory Visit: Payer: Self-pay | Admitting: Pharmacist

## 2017-03-19 ENCOUNTER — Ambulatory Visit: Payer: Self-pay | Admitting: Pharmacist

## 2017-04-02 ENCOUNTER — Other Ambulatory Visit: Payer: Self-pay | Admitting: Family Medicine

## 2017-04-02 DIAGNOSIS — E119 Type 2 diabetes mellitus without complications: Secondary | ICD-10-CM

## 2017-04-02 DIAGNOSIS — Z794 Long term (current) use of insulin: Principal | ICD-10-CM

## 2017-04-29 ENCOUNTER — Other Ambulatory Visit: Payer: Self-pay | Admitting: Nurse Practitioner

## 2017-05-18 ENCOUNTER — Telehealth: Payer: Self-pay | Admitting: *Deleted

## 2017-05-18 NOTE — Telephone Encounter (Signed)
Medication Refill requested for :Adderall  Pharmacy: Return Contact : [phone]

## 2017-05-18 NOTE — Telephone Encounter (Signed)
Last refill was in February for March , please advise, thanks

## 2017-05-21 ENCOUNTER — Other Ambulatory Visit: Payer: Self-pay | Admitting: Family Medicine

## 2017-05-21 MED ORDER — AMPHETAMINE-DEXTROAMPHET ER 20 MG PO CP24: 20.0000 mg | ORAL_CAPSULE | Freq: Every day | ORAL | 0 refills | Status: DC

## 2017-05-21 NOTE — Telephone Encounter (Signed)
Patient can pick up Rx. 

## 2017-05-21 NOTE — Telephone Encounter (Signed)
Patient advised script ready for pick up 

## 2017-06-18 ENCOUNTER — Encounter: Payer: Self-pay | Admitting: Pharmacist

## 2017-06-18 ENCOUNTER — Ambulatory Visit (INDEPENDENT_AMBULATORY_CARE_PROVIDER_SITE_OTHER): Payer: BLUE CROSS/BLUE SHIELD | Admitting: Pharmacist

## 2017-06-18 DIAGNOSIS — E119 Type 2 diabetes mellitus without complications: Secondary | ICD-10-CM | POA: Diagnosis not present

## 2017-06-18 DIAGNOSIS — I10 Essential (primary) hypertension: Secondary | ICD-10-CM

## 2017-06-18 DIAGNOSIS — Z794 Long term (current) use of insulin: Secondary | ICD-10-CM

## 2017-06-18 LAB — BASIC METABOLIC PANEL
BUN: 9 mg/dL (ref 6–23)
CHLORIDE: 100 meq/L (ref 96–112)
CO2: 31 meq/L (ref 19–32)
Calcium: 9.3 mg/dL (ref 8.4–10.5)
Creatinine, Ser: 0.59 mg/dL (ref 0.40–1.20)
GFR: 146.74 mL/min (ref >60.00)
GLUCOSE: 192 mg/dL — AB (ref 70–99)
POTASSIUM: 4.2 meq/L (ref 3.5–5.1)
Sodium: 136 mEq/L (ref 135–145)

## 2017-06-18 LAB — LDL CHOLESTEROL, DIRECT: Direct LDL: 116 mg/dL

## 2017-06-18 LAB — HEMOGLOBIN A1C: Hgb A1c MFr Bld: 9 % — ABNORMAL HIGH (ref 4.6–6.5)

## 2017-06-18 MED ORDER — AMLODIPINE BESYLATE 10 MG PO TABS: 10.0000 mg | ORAL_TABLET | Freq: Every day | ORAL | 1 refills | Status: DC

## 2017-06-18 MED ORDER — EMPAGLIFLOZIN 25 MG PO TABS: 25.0000 mg | ORAL_TABLET | Freq: Every day | ORAL | 1 refills | Status: DC

## 2017-06-18 MED ORDER — LOSARTAN POTASSIUM 50 MG PO TABS: 50.0000 mg | ORAL_TABLET | Freq: Every day | ORAL | 1 refills | Status: DC

## 2017-06-18 MED ORDER — ROSUVASTATIN CALCIUM 20 MG PO TABS: 20.0000 mg | ORAL_TABLET | Freq: Every day | ORAL | 1 refills | Status: DC

## 2017-06-18 NOTE — Progress Notes (Signed)
    S:     Chief Complaint  Patient presents with  . Medication Management    Diabetes, Hypertension    Patient arrives stating she is stressed out with work and has "fallen off the wagon" with her medications.  Presents for diabetes evaluation, education, and management at the request of Dr Adriana Simasook. Patient was referred on 12/06/2016.  Patient was last seen by Primary Care Provider on 02/27/2017.   She reports she has been working 13 hours per day at [employer].  She is hopefully moving to a less stressful position at work.  . Has not refilled her Azor because a month ago her insurance denied it.  She has been "off and on with all of her medications."  No insulin yet today.    Patient denies adherence with medications. She takes all of her medications "on and off" Current diabetes medications include: Jardiance 10 mg daily, Basaglar 50 units daily Current hypertension medications include: amlodipine/olmesartan 10/20 mg daily   Patient denies hypoglycemic events.  Patient reported dietary habits: Eats 3 meals/day.  Reports her diet has been up and down. Her biggest weakness is pastries and pies.   Breakfast:grits or oatmeal Lunch:salad and slice of pizza or vegetables (greens, potatoes) Dinner:has been eating late at night - salad or spaghetti Snacks:chips or some peppermint candy.  Drinks:water, coffee with creamer  Patient reported exercise habits: Has not been exercising due to walking a lot.     Patient denies nocturia. Denies pain/burning upon urination.  Patient denies neuropathy. Patient reports visual changes with blurred vision. She has not had a yearly eye exam.  Patient reports self foot exams. A spot on her foot is itching and she believes it is getting bigger.    Checking CBGs once or twice per week.   CBG ranging from 120-high 200s.  Reports most common value of high 100s.   Denies fasting blood glucose >150 mg/dL.  Higher values are before meals during the day.      O:  Physical Exam  Constitutional: She appears well-developed and well-nourished.  Vitals reviewed.  Review of Systems  Constitutional: Negative.    Lab Results  Component Value Date   HGBA1C 9.0 (H) 06/18/2017   Vitals:   06/18/17 1310 06/18/17 1346  BP: (!) 169/132 (!) 163/115  Pulse: 84 82    A/P: Diabetes longstanding diagnosed currently uncontrolled. Patient denies hypoglycemic events and is able to verbalize appropriate hypoglycemia management plan. Patient denies adherence with medication. Control is suboptimal due to nonadherence to regimen and patient stress at work. Following discussion and approval by Dr Adriana Simasook, the following medication changes were made:  -Restarted basal insulin Basaglar (insulin glargine) 50 units daily -Restarted Jardiance (empagliflozin) 10 mg daily until she finishes current supply. Then increased to 25 mg daily. -Stressed importance of adherence to her regimen  ASCVD risk greater than 7.5% with LDL above goal.Restarted rosuvastatin 20 mg daily.   Hypertension longstanding diagnosed currently uncontrolled.  Patient denies adherence with medication. Control is suboptimal due to nonadherence. Patient amlodipine/olmesartan not approved by insurance.   -Started losartan 50 mg daily -Started amlodipine 10 mg daily  Written patient instructions provided.  Total time in face to face counseling 60 minutes.   Patient will send CBGs via mychart in 1 week. Patient was seen with Dr Adriana Simasook today in clinic and medication changes were discussed and approved prior to initiation

## 2017-06-18 NOTE — Patient Instructions (Addendum)
Stop amlodipine/olmesartan 10 mg/20 mg  Start amlodipine 10 mg daily Start losartan 50 mg daily   Increase Jardiance 10 mg daily until you finish your current supply then increase to 25 mg daily   Restart Basaglar 50 units daily   Restart rosuvastatin 20 mg daily   Send your blood sugar log in 1 week via mychart to Dr Adriana Simasook  Followup with Dr Adriana Simasook in 6 weeks

## 2017-06-19 ENCOUNTER — Telehealth: Payer: Self-pay | Admitting: Family Medicine

## 2017-06-19 NOTE — Progress Notes (Signed)
Care was provided under my supervision. I agree with the management as indicated in the note.  Kourtlyn Charlet DO  

## 2017-06-19 NOTE — Assessment & Plan Note (Signed)
Hypertension longstanding diagnosed currently uncontrolled.  Patient denies adherence with medication. Control is suboptimal due to nonadherence. Patient amlodipine/olmesartan not approved by insurance.   -Started losartan 50 mg daily -Started amlodipine 10 mg daily

## 2017-06-19 NOTE — Telephone Encounter (Signed)
Patient called back in regarding to lab results see documentation under labs.

## 2017-06-19 NOTE — Telephone Encounter (Signed)
Pt called you back regarding results it looks like. Thank you!

## 2017-06-19 NOTE — Assessment & Plan Note (Addendum)
Diabetes longstanding diagnosed currently uncontrolled. Patient denies hypoglycemic events and is able to verbalize appropriate hypoglycemia management plan. Patient denies adherence with medication. Control is suboptimal due to nonadherence to regimen and patient stress at work. Following discussion and approval by Dr Adriana Simasook, the following medication changes were made:  -Restarted basal insulin Basaglar (insulin glargine) 50 units daily -Restarted Jardiance (empagliflozin) 10 mg daily until she finishes current supply. Then increased to 25 mg daily. -Stressed importance of adherence to her regimen ASCVD risk greater than 7.5% with LDL above goal.Restarted rosuvastatin 20 mg daily.

## 2017-06-25 ENCOUNTER — Telehealth: Payer: Self-pay

## 2017-06-25 NOTE — Telephone Encounter (Signed)
Prior authorization submitted for Jardiance .

## 2017-06-28 NOTE — Telephone Encounter (Signed)
Prior authorization approved 06/26/17-06/26/2018 [facility] pharmacy advised.

## 2017-08-08 ENCOUNTER — Encounter: Payer: Self-pay | Admitting: Family Medicine

## 2017-09-18 ENCOUNTER — Ambulatory Visit (INDEPENDENT_AMBULATORY_CARE_PROVIDER_SITE_OTHER): Payer: BLUE CROSS/BLUE SHIELD | Admitting: Family

## 2017-09-18 ENCOUNTER — Encounter: Payer: Self-pay | Admitting: Family

## 2017-09-18 VITALS — BP 126/84 | HR 97 | Temp 98.2°F | Ht 64.0 in | Wt 163.2 lb

## 2017-09-18 DIAGNOSIS — F909 Attention-deficit hyperactivity disorder, unspecified type: Secondary | ICD-10-CM | POA: Diagnosis not present

## 2017-09-18 DIAGNOSIS — Z794 Long term (current) use of insulin: Secondary | ICD-10-CM | POA: Diagnosis not present

## 2017-09-18 DIAGNOSIS — E785 Hyperlipidemia, unspecified: Secondary | ICD-10-CM | POA: Diagnosis not present

## 2017-09-18 DIAGNOSIS — E119 Type 2 diabetes mellitus without complications: Secondary | ICD-10-CM

## 2017-09-18 DIAGNOSIS — I10 Essential (primary) hypertension: Secondary | ICD-10-CM | POA: Diagnosis not present

## 2017-09-18 DIAGNOSIS — Z930 Tracheostomy status: Secondary | ICD-10-CM | POA: Diagnosis not present

## 2017-09-18 LAB — COMPREHENSIVE METABOLIC PANEL
ALBUMIN: 4 g/dL (ref 3.5–5.2)
ALK PHOS: 69 U/L (ref 39–117)
ALT: 9 U/L (ref 0–35)
AST: 12 U/L (ref 0–37)
BUN: 10 mg/dL (ref 6–23)
CO2: 32 mEq/L (ref 19–32)
CREATININE: 0.67 mg/dL (ref 0.40–1.20)
Calcium: 9.5 mg/dL (ref 8.4–10.5)
Chloride: 98 mEq/L (ref 96–112)
GFR: 126.54 mL/min (ref >60.00)
Glucose, Bld: 234 mg/dL — ABNORMAL HIGH (ref 70–99)
Potassium: 4.2 mEq/L (ref 3.5–5.1)
SODIUM: 135 meq/L (ref 135–145)
TOTAL PROTEIN: 8.1 g/dL (ref 6.0–8.3)
Total Bilirubin: 0.4 mg/dL (ref 0.2–1.2)

## 2017-09-18 LAB — HEMOGLOBIN A1C: Hgb A1c MFr Bld: 8.5 % — ABNORMAL HIGH (ref 4.6–6.5)

## 2017-09-18 MED ORDER — AMPHETAMINE-DEXTROAMPHET ER 20 MG PO CP24: 20.0000 mg | ORAL_CAPSULE | Freq: Every day | ORAL | 0 refills | Status: DC

## 2017-09-18 MED ORDER — ROSUVASTATIN CALCIUM 20 MG PO TABS: 20.0000 mg | ORAL_TABLET | Freq: Every day | ORAL | 1 refills | Status: DC

## 2017-09-18 NOTE — Assessment & Plan Note (Addendum)
Out of medication. Refilled.

## 2017-09-18 NOTE — Patient Instructions (Signed)
tdap ( tetunas ) vaccine at local pharmacy  Eye exam  Come back for pap and physical for  Next appointment. We will do fasting labs today.

## 2017-09-18 NOTE — Progress Notes (Signed)
Subjective:    Patient ID: Carol [name], female    DOB: Mar 25, 1981, 36 y.o.   MRN: [id number]  CC: Carol [name] is a 36 y.o. female who presents today for follow up.   HPI: HTN- compliant with medication. Denies exertional chest pain or pressure, numbness or tingling radiating to left arm or jaw, palpitations, dizziness, frequent headaches, changes in vision, or shortness of breath.   DM- had been following with Adelina Mings; last a1c 9. Taking 60 units gargine, usually in the morning. No hypoglycemic episodes. Also on jardiance.   HLD- has ran out of medication. Needs refill.   ADHD- feels good on dose of adderall. No depression, anxiety. No palpitations, anxiety, trouble sleeping.    Tracheostomy- 2010. Had had several dilations of esophagus. Follows Dr Delford Field ENT, at [facility].     HISTORY:  Past Medical History:  Diagnosis Date  . Asthma   . Diabetes mellitus without complication (HCC)   . Hypertension   . Subglottic stenosis    Trach collar   Past Surgical History:  Procedure Laterality Date  . TRACHEOSTOMY  2010   Family History  Problem Relation Age of Onset  . Diabetes Father   . Hypertension Father   . Hypertension Brother   . Breast cancer Neg Hx     Allergies: Lac bovis Current Outpatient Prescriptions on File Prior to Visit  Medication Sig Dispense Refill  . amLODipine (NORVASC) 10 MG tablet Take 1 tablet (10 mg total) by mouth daily. 90 tablet 1  . empagliflozin (JARDIANCE) 25 MG TABS tablet Take 25 mg by mouth daily. 90 tablet 1  . glucose blood test strip Use as instructed up to 2 times per day 100 each 12  . Insulin Glargine (BASAGLAR KWIKPEN) 100 UNIT/ML SOPN Inject 0.5 mLs (50 Units total) into the skin daily. 15 mL 3  . losartan (COZAAR) 50 MG tablet Take 1 tablet (50 mg total) by mouth daily. 90 tablet 1   No current facility-administered medications on file prior to visit.     Social History  Substance Use Topics  . Smoking status: Former  Smoker    Quit date: 02/17/2002  . Smokeless tobacco: Never Used  . Alcohol use 0.0 oz/week     Comment: Rare    Review of Systems  Constitutional: Negative for chills and fever.  Respiratory: Negative for cough.   Cardiovascular: Negative for chest pain and palpitations.  Gastrointestinal: Negative for nausea and vomiting.      Objective:    BP 126/84   Pulse 97   Temp 98.2 F (36.8 C) (Oral)   Ht  (1.626 m)   Wt 163 lb 3.2 oz (74 kg)   SpO2 97%   BMI 28.01 kg/m  BP Readings from Last 3 Encounters:  09/18/17 126/84  06/18/17 (!) 163/115  02/27/17 124/84   Wt Readings from Last 3 Encounters:  09/18/17 163 lb 3.2 oz (74 kg)  06/18/17 159 lb (72.1 kg)  02/27/17 160 lb 4 oz (72.7 kg)    Physical Exam  Constitutional: She appears well-developed and well-nourished.  Eyes: Conjunctivae are normal.  Cardiovascular: Normal rate, regular rhythm, normal heart sounds and normal pulses.   Pulmonary/Chest: Effort normal and breath sounds normal. She has no wheezes. She has no rhonchi. She has no rales.  Neurological: She is alert.  Skin: Skin is warm and dry.  Psychiatric: She has a normal mood and affect. Her speech is normal and behavior is normal. Thought content normal.  Vitals reviewed.      Assessment & Plan:   Problem List Items Addressed This Visit      Cardiovascular and Mediastinum   Benign essential HTN    Well controlled. Will continue current regimen      Relevant Medications   rosuvastatin (CRESTOR) 20 MG tablet   Other Relevant Orders   Comprehensive metabolic panel     Endocrine   DM type 2 (diabetes mellitus, type 2) (HCC)    Pending a1c.       Relevant Medications   rosuvastatin (CRESTOR) 20 MG tablet   Other Relevant Orders   Hemoglobin A1c     Other   Tracheostomy present (HCC)   ADHD (attention deficit hyperactivity disorder) - Primary    Well controlled. Refilled. I looked up patient on [place] Controlled Substances Reporting System  and saw no activity that raised concern of inappropriate use.        Relevant Medications   amphetamine-dextroamphetamine (ADDERALL XR) 20 MG 24 hr capsule   amphetamine-dextroamphetamine (ADDERALL XR) 20 MG 24 hr capsule   amphetamine-dextroamphetamine (ADDERALL XR) 20 MG 24 hr capsule   HLD (hyperlipidemia)    Out of medication. Refilled.       Relevant Medications   rosuvastatin (CRESTOR) 20 MG tablet       I am having Carol [name] maintain her glucose blood, BASAGLAR KWIKPEN, amLODipine, losartan, empagliflozin, rosuvastatin, amphetamine-dextroamphetamine, amphetamine-dextroamphetamine, and amphetamine-dextroamphetamine.   Meds ordered this encounter  Medications  . rosuvastatin (CRESTOR) 20 MG tablet    Sig: Take 1 tablet (20 mg total) by mouth daily.    Dispense:  90 tablet    Refill:  1    Order Specific Question:   Supervising Provider    Answer:   Duncan Dull L [[id number]]  . amphetamine-dextroamphetamine (ADDERALL XR) 20 MG 24 hr capsule    Sig: Take 1 capsule (20 mg total) by mouth daily.    Dispense:  30 capsule    Refill:  0    Order Specific Question:   Supervising Provider    Answer:   Duncan Dull L [[id number]]  . amphetamine-dextroamphetamine (ADDERALL XR) 20 MG 24 hr capsule    Sig: Take 1 capsule (20 mg total) by mouth daily.    Dispense:  30 capsule    Refill:  0    Do not fill before 11/2.    Order Specific Question:   Supervising Provider    Answer:   Sherlene Shams [[id number]]  . amphetamine-dextroamphetamine (ADDERALL XR) 20 MG 24 hr capsule    Sig: Take 1 capsule (20 mg total) by mouth daily.    Dispense:  30 capsule    Refill:  0    Do not fill before 12/2.    Order Specific Question:   Supervising Provider    Answer:   Sherlene Shams [[id number]]    Return precautions given.   Risks, benefits, and alternatives of the medications and treatment plan prescribed today were discussed, and patient expressed understanding.   Education regarding symptom  management and diagnosis given to patient on AVS.  Continue to follow with Allegra Grana, FNP for routine health maintenance.   Jacolyn Reedy and I agreed with plan.   Rennie Plowman, FNP

## 2017-09-18 NOTE — Assessment & Plan Note (Signed)
Pending a1c. 

## 2017-09-18 NOTE — Progress Notes (Signed)
Pre visit review using our clinic review tool, if applicable. No additional management support is needed unless otherwise documented below in the visit note. 

## 2017-09-18 NOTE — Assessment & Plan Note (Signed)
Well controlled. Refilled. I looked up patient on [place] Controlled Substances Reporting System and saw no activity that raised concern of inappropriate use.

## 2017-09-18 NOTE — Assessment & Plan Note (Signed)
Well controlled. Will continue current regimen.

## 2017-09-19 ENCOUNTER — Telehealth: Payer: Self-pay | Admitting: Pharmacist

## 2017-09-19 NOTE — Telephone Encounter (Signed)
Called patient, no answer, left HIPAA-compliant VM requesting she return my call.   Allena Katz, Pharm.D. PGY2 Ambulatory Care Pharmacy Resident Phone: [phone]

## 2017-09-19 NOTE — Telephone Encounter (Signed)
-----   Message from Allegra Grana, FNP sent at 09/19/2017  3:16 PM EDT ----- Would you mind reaching out to patient to set up an appt?   She had been with kelsy and still cannot quite get good control.   Patient is aware.

## 2017-09-20 ENCOUNTER — Encounter: Payer: Self-pay | Admitting: Family

## 2017-09-20 DIAGNOSIS — Z794 Long term (current) use of insulin: Principal | ICD-10-CM

## 2017-09-20 DIAGNOSIS — E119 Type 2 diabetes mellitus without complications: Secondary | ICD-10-CM

## 2017-09-20 MED ORDER — BASAGLAR KWIKPEN 100 UNIT/ML ~~LOC~~ SOPN: 50.0000 [IU] | PEN_INJECTOR | Freq: Every day | SUBCUTANEOUS | 3 refills | Status: DC

## 2017-09-24 ENCOUNTER — Telehealth: Payer: Self-pay | Admitting: Pharmacist

## 2017-09-24 NOTE — Telephone Encounter (Signed)
Called to set up appointment with [facility] at request of Rennie Plowman, NP. No answer. Left HIPAA-compliant VM.   Allena Katz, Pharm.D. PGY2 Ambulatory Care Pharmacy Resident Phone: [phone]

## 2017-09-28 ENCOUNTER — Encounter: Payer: Self-pay | Admitting: Family

## 2017-10-02 ENCOUNTER — Other Ambulatory Visit: Payer: Self-pay | Admitting: Family

## 2017-10-02 DIAGNOSIS — N63 Unspecified lump in unspecified breast: Secondary | ICD-10-CM

## 2017-10-07 NOTE — Progress Notes (Addendum)
S:     Chief Complaint  Patient presents with  . Medication Management    diabetes    Patient arrives in good spirits, ambulating without assistance.  Presents for diabetes evaluation, education, and management at the request of Rennie Plowman, NP. Patient was referred on 09/19/17.  Patient was last seen by Primary Care Provider on 09/18/17. Last Rx Clinic visit on 06/18/17 - at that time it was discovered that she was going without medications 2/2 insurance denial and busy lifestyle so DM meds were restarted and Jardiance was increased, HTN combination med changed to two individual meds and rosuvastatin was restarted. At last office visit, A1C was found to be improved however above goal.   Patient reports that she has restarted all of her mediations. Was out of insulin x1 week as mail order pharmacy required for insurance coverage and 90 day prescriptions were required by mail order pharmacy. Now has full supplies of all meds. Gives herself a "B+" with remembering to take medicaitons  Usually takes Jardiance at night with BP meds and insulin the morning. Endorses occasional hypoglycemia (nocturnal) if she takes them both at night.   CBGs are improved now that she is back on insulin, Had fastings in the low 200s, high 100s when off meds.   Patient reports Diabetes was diagnosed in 2010.   Family/Social History: Father T2DM, Cousin T1DM, no cardiovascular disease in family hx per patient.   Insurance coverage/medication affordability: BCBS, meds are affordable, set up with mail order  Patient reports adherence with medications.  Current diabetes medications include: Jardiance 25 mg daily, Basaglar 50 units daily Current hypertension medications include: losartan 50 mg daily, amlodipine 10 mg daily Patient reports hypoglycemic events. 1 hypo to 56 in the middle of the night  - sx= sweating  Patient reported dietary habits: Eats  Meals/day - "snacker" big breakfast person, pescatarian -  has lost a lot of weight in the past.  Breakfast: 2 boiled eggs + mustard, grits, coffee  Lunch: veggie sandwich Dinner: 3 slices of pieces and 2 mini candy bars Snacks: veggie chips, thin cookies (gingerbread), pastries once/week Drinks: coffee, water, diet soda occasionally   Patient reported exercise habits: gym once/month, walking at work and walking dogs 2x/week for ~20 mins.    Patient denies nocturia. Only when she has been eating CHO-rich foods.  Patient denies pain/burning on urination. Endorses some "itching" when eating poorly but no sx at present, has not had to treat a UTI or yeast infection per patient.  Patient denies neuropathy. - itchy spot on R foot, stable Patient reports visual changes. Some blurriness.  Patient reports self foot exams.   O:  Physical Exam  Constitutional: She appears well-developed and well-nourished.  Skin:     Vitals reviewed.    Review of Systems  Constitutional: Negative for malaise/fatigue.  Eyes: Positive for blurred vision.  Genitourinary: Negative for dysuria, frequency and urgency.     Lab Results  Component Value Date   HGBA1C 8.5 (H) 09/18/2017   Vitals:   10/08/17 0811  BP: 136/85  Pulse: 90   Lipid Panel     Component Value Date/Time   CHOL 207 (H) 12/06/2016 0909   TRIG 53.0 12/06/2016 0909   HDL 57.60 12/06/2016 0909   CHOLHDL 4 12/06/2016 0909   VLDL 10.6 12/06/2016 0909   LDLCALC 139 (H) 12/06/2016 0909   LDLDIRECT 116.0 06/18/2017 1353    Home fasting CBG: 146, 110, 76 2 hour post-prandial/random CBG: does not  check - states that her pre-meal CBGs are in high 100s-low 200s when she does remember to check  10 year ASCVD risk: 4.6% assuming age of at least 740.   A/P: #Diabetes longstanding currently uncontrolled however improved as evidenced by A1C reduction\. Patient reports hypoglycemic events and is able to verbalize appropriate hypoglycemia management plan. Patient reports adherence with medication  now. Control is suboptimal due to frequent medication nonadherence, sedentary lifestyle, dietary indiscretion. Patient now has supply of medication.  Following discussion and approval by Rennie PlowmanMargaret Arnett, NP, the following medication changes were made:  - Continue current meds (Basaglar 50 units daily, Jardiance 25 mg daily)  - Counseled on sick day rules for Jardiance - Extensive conversation had about lifestyle modification and use of a pill box, ability to keep insulin out of refrigerator for 28 days. - Increase CBG checks to before breakfast and 2 hrs after meals up to four times daily. - Next A1C anticipated 12/19/2016 or later.    #ASCVD risk - Patient with DM, <434 years old, LDL between 70-189, and ASCVD risk of 4.6% assuming age no less than 40; however, patient above LDL goal of <100 mg/dL - Continued rosuvastatin 20 mg.   #Hypertension longstanding currently reasonably controlled.  Patient reports adherence with medication.  - Continue current meds - Sent 90 day refill to mail order for BP meds as patient states she is almost out.   Patient was seen with Rennie PlowmanMargaret Arnett, NP today in clinic and medication changes were discussed and approved prior to initiation.Written patient instructions provided.  Total time in face to face counseling 50 minutes.    Follow up in Pharmacist Clinic Visit 4-6 weeks.   Allena Katzaroline E Kavya Haag, Pharm.D. PGY2 Ambulatory Care Pharmacy Resident Phone: [phone]  Agree with plan. Rennie PlowmanMargaret Arnett, NP

## 2017-10-08 ENCOUNTER — Encounter: Payer: Self-pay | Admitting: Pharmacist

## 2017-10-08 ENCOUNTER — Ambulatory Visit (INDEPENDENT_AMBULATORY_CARE_PROVIDER_SITE_OTHER): Payer: BLUE CROSS/BLUE SHIELD | Admitting: Pharmacist

## 2017-10-08 DIAGNOSIS — I10 Essential (primary) hypertension: Secondary | ICD-10-CM

## 2017-10-08 DIAGNOSIS — E785 Hyperlipidemia, unspecified: Secondary | ICD-10-CM

## 2017-10-08 MED ORDER — AMLODIPINE BESYLATE 10 MG PO TABS: 10.0000 mg | ORAL_TABLET | Freq: Every day | ORAL | 2 refills | Status: DC

## 2017-10-08 MED ORDER — ROSUVASTATIN CALCIUM 20 MG PO TABS: 20.0000 mg | ORAL_TABLET | Freq: Every day | ORAL | 2 refills | Status: DC

## 2017-10-08 MED ORDER — LOSARTAN POTASSIUM 50 MG PO TABS: 50.0000 mg | ORAL_TABLET | Freq: Every day | ORAL | 2 refills | Status: DC

## 2017-10-08 NOTE — Assessment & Plan Note (Signed)
Hypertension longstanding currently reasonably controlled.  Patient reports adherence with medication.  - Continue current meds - Sent 90 day refill to mail order for BP meds as patient states she is almost out.

## 2017-10-08 NOTE — Assessment & Plan Note (Signed)
Diabetes longstanding currently uncontrolled however improved as evidenced by A1C reduction\. Patient reports hypoglycemic events and is able to verbalize appropriate hypoglycemia management plan. Patient reports adherence with medication now. Control is suboptimal due to frequent medication nonadherence, sedentary lifestyle, dietary indiscretion. Patient now has supply of medication.  Following discussion and approval by Rennie PlowmanMargaret Arnett, NP, the following medication changes were made:  - Continue current meds (Basaglar 50 units daily, Jardiance 25 mg daily)  - Counseled on sick day rules for Jardiance - Extensive conversation had about lifestyle modification and use of a pill box, ability to keep insulin out of refrigerator for 28 days. - Increase CBG checks to before breakfast and 2 hrs after meals up to four times daily. - Next A1C anticipated 12/19/2016 or later.

## 2017-10-08 NOTE — Patient Instructions (Signed)
Thank you for coming to see me today! We want to know what your blood sugars are doing 2 hrs after you have eaten. Write those down a couple times a week.   No changes to medications otherwise. We need to work on your medication adherence.   I will call your insurance and try to figure out which CGM they cover if any. That would be so helpful for you!   Please come back to see me in 4-6 weeks. Call me if you need anything - [phone]

## 2017-10-10 ENCOUNTER — Telehealth: Payer: Self-pay | Admitting: Pharmacist

## 2017-10-10 NOTE — Telephone Encounter (Signed)
Called [employer] to initiate insurance query to determine eligibility for Dexcom G6 continuous glucose monitoring (CGM) system. [facility] will be reaching out to Rennie PlowmanMargaret Arnett for prescriptions and clinical information for the patient to determine eligibility. Just FYI.   Allena Katzaroline E Aahana Elza, Pharm.D. PGY2 Ambulatory Care Pharmacy Resident Phone: [phone]

## 2017-10-16 ENCOUNTER — Telehealth: Payer: Self-pay | Admitting: Family

## 2017-10-16 NOTE — Telephone Encounter (Signed)
Please call pt  Ensure no more hypoglycemic episodes  If so, advise another appt with caroline or another provider here.

## 2017-10-16 NOTE — Telephone Encounter (Signed)
Patient feels very well and she stated her levels have been stable and has not had a single episode of hypoglycemic.

## 2017-10-18 HISTORY — PX: BREAST BIOPSY: SHX20

## 2017-10-24 ENCOUNTER — Encounter: Payer: Self-pay | Admitting: Family

## 2017-11-02 ENCOUNTER — Other Ambulatory Visit: Payer: Self-pay

## 2017-11-12 ENCOUNTER — Other Ambulatory Visit: Payer: Self-pay | Admitting: Family

## 2017-11-12 ENCOUNTER — Ambulatory Visit
Admission: RE | Admit: 2017-11-12 | Discharge: 2017-11-12 | Disposition: A | Discharge status: Home / Self Care | Payer: BLUE CROSS/BLUE SHIELD | Source: Ambulatory Visit | Attending: Family | Admitting: Family

## 2017-11-12 DIAGNOSIS — N6320 Unspecified lump in the left breast, unspecified quadrant: Secondary | ICD-10-CM | POA: Diagnosis not present

## 2017-11-12 DIAGNOSIS — N6322 Unspecified lump in the left breast, upper inner quadrant: Secondary | ICD-10-CM | POA: Diagnosis not present

## 2017-11-12 DIAGNOSIS — N63 Unspecified lump in unspecified breast: Secondary | ICD-10-CM

## 2017-11-12 DIAGNOSIS — R928 Other abnormal and inconclusive findings on diagnostic imaging of breast: Secondary | ICD-10-CM | POA: Diagnosis not present

## 2017-11-12 DIAGNOSIS — R922 Inconclusive mammogram: Secondary | ICD-10-CM | POA: Diagnosis not present

## 2017-11-12 DIAGNOSIS — N6321 Unspecified lump in the left breast, upper outer quadrant: Secondary | ICD-10-CM | POA: Diagnosis not present

## 2017-11-14 ENCOUNTER — Telehealth: Payer: Self-pay | Admitting: *Deleted

## 2017-11-14 ENCOUNTER — Other Ambulatory Visit: Payer: Self-pay | Admitting: Internal Medicine

## 2017-11-14 DIAGNOSIS — R928 Other abnormal and inconclusive findings on diagnostic imaging of breast: Secondary | ICD-10-CM

## 2017-11-14 DIAGNOSIS — N6320 Unspecified lump in the left breast, unspecified quadrant: Secondary | ICD-10-CM

## 2017-11-14 NOTE — Telephone Encounter (Signed)
I have pended the order ok to sign in MD.

## 2017-11-14 NOTE — Telephone Encounter (Signed)
I spoke to pt on the phone about mammogram results and about options.  We discussed radiology biopsy vs surgery evaluation.  She prefers to see surgery.  I have placed an order for referral to Dr Lemar LivingsByrnett.

## 2017-11-14 NOTE — Telephone Encounter (Signed)
Copied from CRM [id number]. Topic: General - Other >> Nov 14, 2017  9:47 AM Gerrianne ScalePayne, Angela L wrote: Reason for CRM: [facility] [phone] would like orders for left U/S biopsy

## 2017-11-15 NOTE — Telephone Encounter (Signed)
Patient aware.

## 2017-11-16 ENCOUNTER — Ambulatory Visit: Payer: BLUE CROSS/BLUE SHIELD | Admitting: General Surgery

## 2017-11-16 ENCOUNTER — Encounter: Payer: Self-pay | Admitting: General Surgery

## 2017-11-16 ENCOUNTER — Inpatient Hospital Stay: Payer: Self-pay

## 2017-11-16 VITALS — BP 110/80 | HR 78 | Resp 14 | Ht <= 58 in | Wt 166.0 lb

## 2017-11-16 DIAGNOSIS — N6314 Unspecified lump in the right breast, lower inner quadrant: Secondary | ICD-10-CM

## 2017-11-16 DIAGNOSIS — N6322 Unspecified lump in the left breast, upper inner quadrant: Secondary | ICD-10-CM | POA: Diagnosis not present

## 2017-11-16 DIAGNOSIS — N6321 Unspecified lump in the left breast, upper outer quadrant: Secondary | ICD-10-CM

## 2017-11-16 DIAGNOSIS — D242 Benign neoplasm of left breast: Secondary | ICD-10-CM | POA: Diagnosis not present

## 2017-11-16 NOTE — Progress Notes (Signed)
Patient ID: Carol [name], female   DOB: [birth year], 36 y.o.   MRN: [id number]  Chief Complaint  Patient presents with  . Follow-up    abdnormal mammogram     HPI Carol [name] is a 36 y.o. female who presents for a breast evaluation. The most recent mammogram was done on 11/12/2017.  The patient reports in the late 2000 she was followed with serial ultrasounds for a mass in the right breast which was subsequently released from follow-up.  Recently a mass in the left breast was identified and mammograms obtained initially in March 2018 and then follow-up studies with ultrasound in November.   Patient does perform intermittent self breast checks and gets regular mammograms done.  She is not really sure if there is been any interval change since discovery of the left upper inner quadrant lesion.    The patient works at [employer].    Patient adopted  a 38 year old girl, three years ago.  HPI    Past Medical History:  Diagnosis Date  . Diabetes mellitus without complication (HCC) 2009  . Hypertension   . Subglottic stenosis    Trach collar    Past Surgical History:  Procedure Laterality Date  . BREAST BIOPSY Right   . TRACHEOSTOMY  2010    Family History  Problem Relation Age of Onset  . Diabetes Father   . Hypertension Father   . Hypertension Brother   . Breast cancer Neg Hx     Social History Social History   Tobacco Use  . Smoking status: Former Smoker    Last attempt to quit: 02/17/2002    Years since quitting: 15.7  . Smokeless tobacco: Never Used  Substance Use Topics  . Alcohol use: Yes    Alcohol/week: 0.0 oz    Comment: Rare  . Drug use: No    Allergies  Allergen Reactions  . Lac Bovis Nausea Only and Swelling    Gas, nausea, lip swelling    Current Outpatient Medications  Medication Sig Dispense Refill  . amLODipine (NORVASC) 10 MG tablet Take 1 tablet (10 mg total) by mouth daily. 90 tablet 2  . amphetamine-dextroamphetamine (ADDERALL  XR) 20 MG 24 hr capsule Take 1 capsule (20 mg total) by mouth daily. 30 capsule 0  . empagliflozin (JARDIANCE) 25 MG TABS tablet Take 25 mg by mouth daily. 90 tablet 1  . glucose blood test strip Use as instructed up to 2 times per day 100 each 12  . Insulin Glargine (BASAGLAR KWIKPEN) 100 UNIT/ML SOPN Inject 0.5 mLs (50 Units total) into the skin daily. 15 mL 3  . losartan (COZAAR) 50 MG tablet Take 1 tablet (50 mg total) by mouth daily. 90 tablet 2  . rosuvastatin (CRESTOR) 20 MG tablet Take 1 tablet (20 mg total) by mouth daily. 90 tablet 2   No current facility-administered medications for this visit.     Review of Systems Review of Systems  Constitutional: Negative.   Respiratory: Negative.   Cardiovascular: Negative.     Blood pressure 110/80, pulse 78, resp. rate 14, height 4\' 6"  (1.372 m), weight 166 lb (75.3 kg), last menstrual period 10/22/2017.  Physical Exam Physical Exam  Constitutional: She is oriented to person, place, and time. She appears well-developed and well-nourished.  Eyes: Conjunctivae are normal. No scleral icterus.  Neck: Neck supple.  Cardiovascular: Normal rate, regular rhythm and normal heart sounds.  Pulmonary/Chest: Effort normal and breath sounds normal. Right breast exhibits mass (2 cm  left breast mass @ 5:30 ,2 cm from nipple . ). Right breast exhibits no inverted nipple, no nipple discharge, no skin change and no tenderness. Left breast exhibits mass ( 11 0'clock 2 nodules left breast ). Left breast exhibits no inverted nipple, no nipple discharge, no skin change and no tenderness.    Lymphadenopathy:    She has no cervical adenopathy.    She has no axillary adenopathy.       Right: No supraclavicular adenopathy present.       Left: No supraclavicular adenopathy present.  Neurological: She is alert and oriented to person, place, and time.  Skin: Skin is warm and dry.    Data Reviewed Ultrasound of the right breast  2 cm mobile mass 4 cm from  the nipple showed a well-defined 1.5 x 1.6 x 2.0 cm lesion.  No interval change from her in 2009 exam. Bilateral diagnostic mammograms dated March 09, 1999 18 days prior obtained for a patient reported mass in the upper inner aspect of the right breast 3 weeks earlier reported multiple oval circumscribed masses.  On the right side measuring 2.1, essentially unchanged from 2009.  Underlying the marker in the upper inner quadrant of the left breast a circumscribed mass was noted.  Ultrasound examination at that time reported 2 lesions in the upper inner quadrant of the left breast one measuring up to 12 mm and the second measuring 1.7 cm.  The thought to represent fibroadenomas and short-term follow-up recommended.  BI-RADS-3.  Left diagnostic mammogram and left breast ultrasound dated November 12, 2017 showed the lesions in the upper inner quadrant of the left breast to have increased modestly in size to maximum of 1.1 and 1.8 cm respectively.  BIRAD-4.  The mammographic and ultrasound images were reviewed.  The smaller lesion was more troublesome on review of the images.  Mammogram really added nothing to the exam.  Based on the interval change between March and November I recommended biopsy of both lesions to confirm that they are fibroadenomas.  Ultrasound examination of the left breast in the upper inner quadrant was undertaken.  The dominant mass that is palpable is a smoothly marginated lesion, homogeneous echo pattern.  Difficult to assess posterior enhancement as it sits on the pectoralis fascia.  This measures 1.07 x 1.53 x 1.91 cm.  It has 1 soft lobulation.  This likely represents a fibroadenoma.  Just medial to this at the 1130 o'clock position is a multilobulated mass with 3 dominant areas and mild torsion of the adjacent tissue.  This likely is a multilobulated lipoma.  The maximum diameter of this area is 0.7 x 1.1 x 1.7 cm.  BI-RADS-4.  The patient was amenable to vacuum biopsy.  Under  ultrasound guidance 10 cc of 0.5% Xylocaine with 0.25% Marcaine with 1-200,000 of epinephrine was utilized and well-tolerated.  ChloraPrep was applied to the skin.  The lesion that clearly represented a fibroadenoma at the 11 o'clock position was addressed first from a lateral approach.  The 10-gauge Encor device was placed into the center of the fibroadenoma and 7 core samples obtained with scant discomfort.  A postbiopsy clip was placed and there was a moderate reduction in volume to 0.88 x 1.73.  No bleeding noted.  The multilobulated area at the 1130 o'clock position, 8 cm from the nipple was then sampled in 2 locations to obtain the dominant nodules.  A total of 10 core samples were taken making use of the 10-gauge Encor device.  A postbiopsy clip was placed in this lesion as well.  No bleeding noted.  Skin defect was closed with benzoin and Steri-Strip followed by Telfa and Tegaderm dressing.   Assessment    Likely fibroadenomas of the upper inner quadrant of the left breast.    Plan    The patient was instructed on postoperative wound care.  Written instructions provided.  She will be contacted when biopsy results are available.     HPI, Physical Exam, Assessment and Plan have been scribed under the direction and in the presence of Donnalee CurryJeffrey Byrnett, MD.  Ples SpecterJessica Qualls, CMA  I have completed the exam and reviewed the above documentation for accuracy and completeness.  I agree with the above.  Museum/gallery conservatorDragon Technology has been used and any errors in dictation or transcription are unintentional.  Donnalee CurryJeffrey Byrnett, M.D., F.A.C.S.  Earline MayotteByrnett, Jeffrey W 11/16/2017, 11:27 AM

## 2017-11-16 NOTE — Patient Instructions (Signed)

## 2017-11-19 ENCOUNTER — Telehealth: Payer: Self-pay | Admitting: General Surgery

## 2017-11-19 NOTE — Telephone Encounter (Signed)
The patient was contacted with biopsy results of the 2 lesions in the upper inner quadrant of the left breast.  Both lesions represented fibroadenomas.  She reported some soreness the day after the procedure but presently is asymptomatic.  She did make use of ice packs and her bra as requested.  The patient is asymptomatic in regards to these lesions and biopsy was prompted by a mild change in size on serial ultrasounds.  Unless she develops a symptomatic lesion, the lesion continues to increase in size during mammographic/ultrasound follow-up surgical intervention is not required.  Patient will call if there are any questions or concerns.

## 2018-02-19 ENCOUNTER — Telehealth: Payer: Self-pay | Admitting: Family

## 2018-02-19 DIAGNOSIS — F909 Attention-deficit hyperactivity disorder, unspecified type: Secondary | ICD-10-CM

## 2018-02-19 NOTE — Telephone Encounter (Signed)
Last office visit 09-18-17

## 2018-02-19 NOTE — Telephone Encounter (Signed)
Copied from CRM [phone]. Topic: Quick Communication - Rx Refill/Question >> Feb 19, 2018  9:49 AM Diana EvesHoyt, Maryann B wrote: Medication: adderall   Pt has an appt set for 03/11/18 needing refill until that appt. Pt states she has a weeks worth left.    Preferred Pharmacy (with phone number or street name): [facility] [phone] - [place], [place] - [address]   Agent: Please be advised that RX refills may take up to 3 business days. We ask that you follow-up with your pharmacy.

## 2018-02-20 MED ORDER — AMPHETAMINE-DEXTROAMPHET ER 20 MG PO CP24: 20.0000 mg | ORAL_CAPSULE | Freq: Every day | ORAL | 0 refills | Status: DC

## 2018-02-20 MED ORDER — AMPHETAMINE-DEXTROAMPHET ER 20 MG PO CP24
20.0000 mg | ORAL_CAPSULE | Freq: Every day | ORAL | 0 refills | Status: DC
Start: 2018-02-20 — End: 2018-02-20

## 2018-02-20 NOTE — Telephone Encounter (Signed)
Patient advised scripts ready for pick up . Placed up front for pick up

## 2018-02-20 NOTE — Telephone Encounter (Signed)
I looked up patient on [place] Controlled Substances Reporting System and saw no activity that raised concern of inappropriate use.   Refilled  Ask pt to come pick up

## 2018-03-11 ENCOUNTER — Encounter: Payer: Self-pay | Admitting: Family

## 2018-03-11 ENCOUNTER — Ambulatory Visit: Payer: BLUE CROSS/BLUE SHIELD | Admitting: Family

## 2018-03-11 VITALS — BP 124/66 | HR 90 | Temp 98.2°F | Resp 16 | Ht 64.0 in | Wt 173.0 lb

## 2018-03-11 DIAGNOSIS — Z794 Long term (current) use of insulin: Secondary | ICD-10-CM | POA: Diagnosis not present

## 2018-03-11 DIAGNOSIS — L989 Disorder of the skin and subcutaneous tissue, unspecified: Secondary | ICD-10-CM | POA: Insufficient documentation

## 2018-03-11 DIAGNOSIS — I10 Essential (primary) hypertension: Secondary | ICD-10-CM

## 2018-03-11 DIAGNOSIS — E118 Type 2 diabetes mellitus with unspecified complications: Secondary | ICD-10-CM

## 2018-03-11 LAB — BASIC METABOLIC PANEL
BUN: 10 mg/dL (ref 6–23)
CALCIUM: 9.2 mg/dL (ref 8.4–10.5)
CO2: 31 mEq/L (ref 19–32)
CREATININE: 0.58 mg/dL (ref 0.40–1.20)
Chloride: 100 mEq/L (ref 96–112)
GFR: 149.09 mL/min (ref >60.00)
Glucose, Bld: 96 mg/dL (ref 70–99)
Potassium: 4 mEq/L (ref 3.5–5.1)
Sodium: 137 mEq/L (ref 135–145)

## 2018-03-11 LAB — HEMOGLOBIN A1C: HEMOGLOBIN A1C: 8.4 % — AB (ref 4.6–6.5)

## 2018-03-11 NOTE — Patient Instructions (Addendum)
Return for physical and pap.   Today we discussed referrals, orders. DERMATOLOGY, nutritionist   I have placed these orders in the system for you.  Please be sure to give [place] a call if you have not heard from our office regarding scheduling a test or regarding referral in a timely manner.  It is very important that you let me know as soon as possible.     This is  Dr. Melina Schools  example of a  "Low GI"  Diet:  It will allow you to lose 4 to 8  lbs  per month if you follow it carefully.  Your goal with exercise is a minimum of 30 minutes of aerobic exercise 5 days per week (Walking does not count once it becomes easy!)    All of the foods can be found at grocery stores and in bulk at [employer].  The Atkins protein bars and shakes are available in more varieties at Target, WalMart and Lowe's Foods.     7 AM Breakfast:  Choose from the following:  Low carbohydrate Protein  Shakes (I recommend the  Premier Protein chocolate shakes,  EAS AdvantEdge "Carb Control" shakes  Or the Atkins shakes all are under 3 net carbs)     a scrambled egg/bacon/cheese burrito made with Mission's "carb balance" whole wheat tortilla  (about 10 net carbs )  Medical laboratory scientific officer (basically a quiche without the pastry crust) that is eaten cold and very convenient way to get your eggs.  8 carbs)  If you make your own protein shakes, avoid bananas and pineapple,  And use low carb greek yogurt or original /unsweetened almond or soy milk    Avoid cereal and bananas, oatmeal and cream of wheat and grits. They are loaded with carbohydrates!   10 AM: high protein snack:  Protein bar by Atkins (the snack size, under 200 cal, usually < 6 net carbs).    A stick of cheese:  Around 1 carb,  100 cal     Dannon Light n Fit [place] Yogurt  (80 cal, 8 carbs)  Other so called "protein bars" and Greek yogurts tend to be loaded with carbohydrates.  Remember, in food advertising, the word "energy" is synonymous for "  carbohydrate."  Lunch:   A Sandwich using the bread choices listed, Can use any  Eggs,  lunchmeat, grilled meat or canned tuna), avocado, regular mayo/mustard  and cheese.  A Salad using blue cheese, ranch,  Goddess or vinagrette,  Avoid taco shells, croutons or "confetti" and no "candied nuts" but regular nuts OK.   No pretzels, nabs  or chips.  Pickles and miniature sweet peppers are a good low carb alternative that provide a "crunch"  The bread is the only source of carbohydrate in a sandwich and  can be decreased by trying some of the attached alternatives to traditional loaf bread   Avoid "Low fat dressings, as well as Reyne Dumas and [employer] Foods dressings They are loaded with sugar!   3 PM/ Mid day  Snack:  Consider  1 ounce of  almonds, walnuts, pistachios, pecans, peanuts,  Macadamia nuts or a nut medley.  Avoid "granola and granola bars "  Mixed nuts are ok in moderation as long as there are no raisins,  cranberries or dried fruit.   KIND bars are OK if you get the low glycemic index variety   Try the prosciutto/mozzarella cheese sticks by Fiorruci  In deli /backery section   High protein  6 PM  Dinner:     Meat/fowl/fish with a green salad, and either broccoli, cauliflower, green beans, spinach, brussel sprouts or  Lima beans. DO NOT BREAD THE PROTEIN!!      There is a low carb pasta by Dreamfield's that is acceptable and tastes great: only 5 digestible carbs/serving.( All grocery stores but BJs carry it ) Several ready made meals are available low carb:   Try Michel Angelo's chicken piccata or chicken or eggplant parm over low carb pasta.(Lowes and BJs)   Clifton CustardAaron Sanchez's "Carnitas" (pulled pork, no sauce,  0 carbs) or his beef pot roast to make a dinner burrito (at BJ's)  Pesto over low carb pasta (bj's sells a good quality pesto in the center refrigerated section of the deli   Try satueeing  Roosvelt HarpsBok Choy with mushroooms as a good side   Green Giant makes a mashed cauliflower  that tastes like mashed potatoes  Whole wheat pasta is still full of digestible carbs and  Not as low in glycemic index as Dreamfield's.   Brown rice is still rice,  So skip the rice and noodles if you eat Congohinese or New Zealandhai (or at least limit to 1/2 cup)  9 PM snack :   Breyer's "low carb" fudgsicle or  ice cream bar (Carb Smart line), or  Weight Watcher's ice cream bar , or another "no sugar added" ice cream;  a serving of fresh berries/cherries with whipped cream   Cheese or DANNON'S LlGHT N FIT GREEK YOGURT  8 ounces of [place] Diamond unsweetened almond/cococunut milk    Treat yourself to a parfait made with whipped cream blueberiies, walnuts and vanilla greek yogurt  Avoid bananas, pineapple, grapes  and watermelon on a regular basis because they are high in sugar.  THINK OF THEM AS DESSERT  Remember that snack Substitutions should be less than 10 NET carbs per serving and meals < 20 carbs. Remember to subtract fiber grams to get the "net carbs."  @TULLOBREADPACKAGE @

## 2018-03-11 NOTE — Assessment & Plan Note (Signed)
Pending consult. Concern due to pruritus, growing in size.

## 2018-03-11 NOTE — Progress Notes (Signed)
Subjective:    Patient ID: Carol [name], female    DOB: December 05, 1981, 37 y.o.   MRN: [id number]  CC: Carol [name] is a 37 y.o. female who presents today for follow up.   HPI: Weight gain-  10 pounds.   HTN- compliant. Denies exertional chest pain or pressure, numbness or tingling radiating to left arm or jaw, palpitations, dizziness, frequent headaches, changes in vision, or shortness of breath.    DM-  Takes basaglar in the mornings 50units, no hypoglycemia episodes . Continous  Monitor.  Average fasting blood sugar, 187.   Describes a flat itchy lesion right top of foot.  Is been there about 5 years however past several months it appears bigger in size.  Nonbleeding, no discharge.       HISTORY:  Past Medical History:  Diagnosis Date  . Diabetes mellitus without complication (HCC) 2009  . Hypertension   . Subglottic stenosis    Trach collar   Past Surgical History:  Procedure Laterality Date  . BREAST BIOPSY Right   . TRACHEOSTOMY  2010   Family History  Problem Relation Age of Onset  . Diabetes Father   . Hypertension Father   . Hypertension Brother   . Breast cancer Neg Hx     Allergies: Lac bovis Current Outpatient Medications on File Prior to Visit  Medication Sig Dispense Refill  . amLODipine (NORVASC) 10 MG tablet Take 1 tablet (10 mg total) by mouth daily. 90 tablet 2  . amphetamine-dextroamphetamine (ADDERALL XR) 20 MG 24 hr capsule Take 1 capsule (20 mg total) by mouth daily. 30 capsule 0  . empagliflozin (JARDIANCE) 25 MG TABS tablet Take 25 mg by mouth daily. 90 tablet 1  . glucose blood test strip Use as instructed up to 2 times per day 100 each 12  . Insulin Glargine (BASAGLAR KWIKPEN) 100 UNIT/ML SOPN Inject 0.5 mLs (50 Units total) into the skin daily. 15 mL 3  . losartan (COZAAR) 50 MG tablet Take 1 tablet (50 mg total) by mouth daily. 90 tablet 2  . rosuvastatin (CRESTOR) 20 MG tablet Take 1 tablet (20 mg total) by mouth daily. 90 tablet  2   No current facility-administered medications on file prior to visit.     Social History   Tobacco Use  . Smoking status: Former Smoker    Last attempt to quit: 02/17/2002    Years since quitting: 16.0  . Smokeless tobacco: Never Used  Substance Use Topics  . Alcohol use: Yes    Alcohol/week: 0.0 oz    Comment: Rare  . Drug use: No    Review of Systems  Constitutional: Negative for chills and fever.  Respiratory: Negative for cough.   Cardiovascular: Negative for chest pain and palpitations.  Gastrointestinal: Negative for nausea and vomiting.  Skin: Negative for color change and wound.      Objective:    BP 124/66 (BP Location: Right Arm, Patient Position: Sitting, Cuff Size: Normal)   Pulse 90   Temp 98.2 F (36.8 C) (Oral)   Resp 16   Ht 5\' 4"  (1.626 m)   Wt 173 lb (78.5 kg)   SpO2 96%   BMI 29.70 kg/m  BP Readings from Last 3 Encounters:  03/11/18 124/66  11/16/17 110/80  10/08/17 136/85   Wt Readings from Last 3 Encounters:  03/11/18 173 lb (78.5 kg)  11/16/17 166 lb (75.3 kg)  10/08/17 164 lb 6.4 oz (74.6 kg)    Physical Exam  Constitutional:  She appears well-developed and well-nourished.  Eyes: Conjunctivae are normal.  Cardiovascular: Normal rate, regular rhythm, normal heart sounds and normal pulses.  Pulmonary/Chest: Effort normal and breath sounds normal. She has no wheezes. She has no rhonchi. She has no rales.  Neurological: She is alert.  Skin: Skin is warm and dry.     approx  6 cm irregular shaped macule noted right dorsal aspect of foot.  Scaling in appearance.  Nonbleeding.  Psychiatric: She has a normal mood and affect. Her speech is normal and behavior is normal. Thought content normal.  Vitals reviewed.      Assessment & Plan:   Problem List Items Addressed This Visit      Cardiovascular and Mediastinum   Benign essential HTN    At goal.  Continue current regimen.      Relevant Orders   Basic metabolic panel      Endocrine   DM type 2 (diabetes mellitus, type 2) (HCC) - Primary    Still appears to be uncontrolled, however improved.  Pending A1c.  In the context of weight gain, discussed lifestyle modifications however discussed with patient if a1c improved from the 9% , we could discuss changing basal insulin to victoza which may facilitate weight loss.  We will continue to discuss this.      Relevant Orders   Ambulatory referral to diabetic education   Hemoglobin A1c     Musculoskeletal and Integument   Skin lesion    Pending consult. Concern due to pruritus, growing in size.       Relevant Orders   Ambulatory referral to Dermatology       I am having Carol [name] maintain her glucose blood, empagliflozin, BASAGLAR KWIKPEN, amLODipine, losartan, rosuvastatin, and amphetamine-dextroamphetamine.   No orders of the defined types were placed in this encounter.   Return precautions given.   Risks, benefits, and alternatives of the medications and treatment plan prescribed today were discussed, and patient expressed understanding.   Education regarding symptom management and diagnosis given to patient on AVS.  Continue to follow with Allegra GranaArnett, Shamar Kracke G, FNP for routine health maintenance.   Carol [name] and I agreed with plan.   Carol PlowmanMargaret Daxten Kovalenko, FNP

## 2018-03-11 NOTE — Assessment & Plan Note (Signed)
Still appears to be uncontrolled, however improved.  Pending A1c.  In the context of weight gain, discussed lifestyle modifications however discussed with patient if a1c improved from the 9% , we could discuss changing basal insulin to victoza which may facilitate weight loss.  We will continue to discuss this.

## 2018-03-11 NOTE — Assessment & Plan Note (Signed)
At goal. Continue current regimen. 

## 2018-03-13 ENCOUNTER — Encounter: Payer: Self-pay | Admitting: Family

## 2018-03-15 ENCOUNTER — Encounter: Payer: Self-pay | Admitting: Family

## 2018-03-20 ENCOUNTER — Other Ambulatory Visit: Payer: Self-pay | Admitting: Family

## 2018-03-20 DIAGNOSIS — Z794 Long term (current) use of insulin: Principal | ICD-10-CM

## 2018-03-20 DIAGNOSIS — E118 Type 2 diabetes mellitus with unspecified complications: Secondary | ICD-10-CM

## 2018-03-20 MED ORDER — LIRAGLUTIDE 18 MG/3ML ~~LOC~~ SOPN: 0.6000 mg | PEN_INJECTOR | Freq: Every day | SUBCUTANEOUS | 2 refills | Status: DC

## 2018-03-20 NOTE — Progress Notes (Signed)
close

## 2018-03-21 ENCOUNTER — Encounter: Payer: BLUE CROSS/BLUE SHIELD | Attending: Family | Admitting: *Deleted

## 2018-03-21 ENCOUNTER — Encounter: Payer: Self-pay | Admitting: *Deleted

## 2018-03-21 VITALS — BP 120/86 | Ht 64.0 in | Wt 171.6 lb

## 2018-03-21 DIAGNOSIS — E119 Type 2 diabetes mellitus without complications: Secondary | ICD-10-CM

## 2018-03-21 DIAGNOSIS — E118 Type 2 diabetes mellitus with unspecified complications: Secondary | ICD-10-CM | POA: Insufficient documentation

## 2018-03-21 DIAGNOSIS — Z794 Long term (current) use of insulin: Secondary | ICD-10-CM | POA: Diagnosis not present

## 2018-03-21 DIAGNOSIS — Z713 Dietary counseling and surveillance: Secondary | ICD-10-CM | POA: Diagnosis not present

## 2018-03-21 IMAGING — MG MM DIGITAL DIAGNOSTIC UNILAT*L* W/ TOMO W/ CAD
8 series · 8 of 16 positions shown · non-contrast
Comparison: Previous exam(s).

CLINICAL DATA: Follow-up of probably benign left breast upper inner
quadrant masses.

EXAM:
2D DIGITAL DIAGNOSTIC LEFT MAMMOGRAM WITH CAD AND ADJUNCT TOMO
ULTRASOUND LEFT BREAST

[L MLO synth-2D]
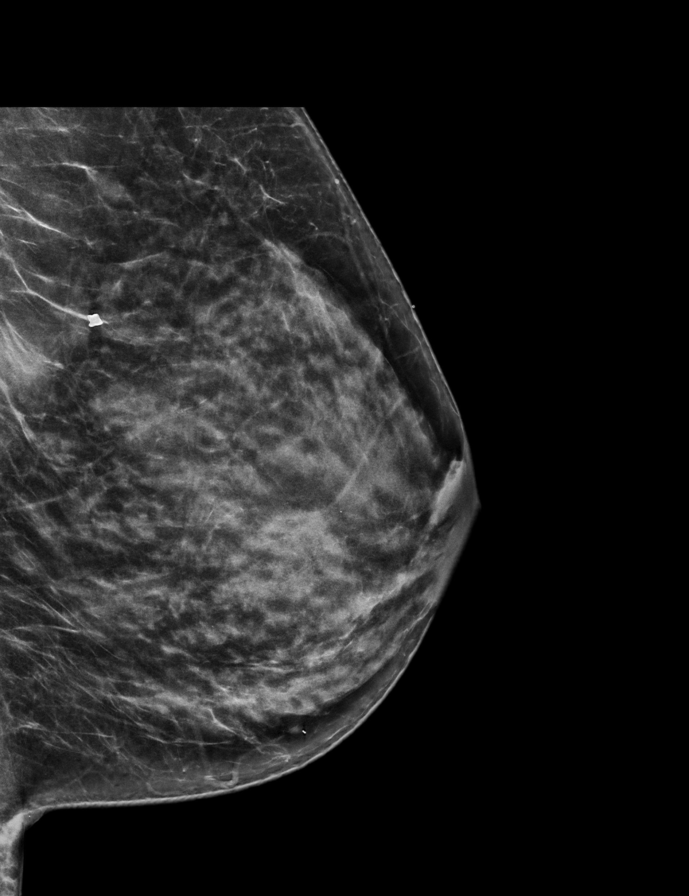

[L CC (1 of 2)]
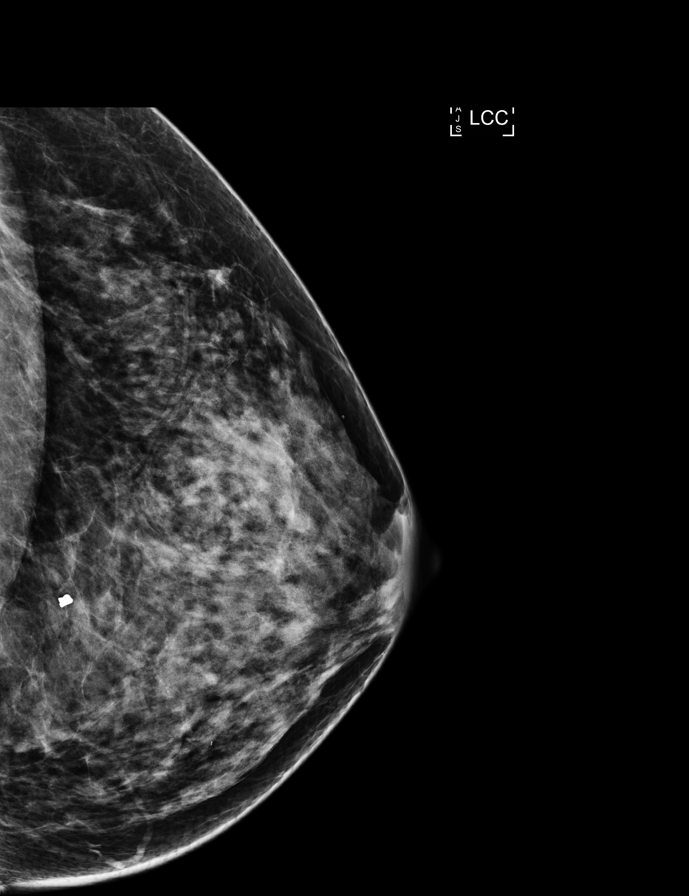

[L MLO (1 of 2)]
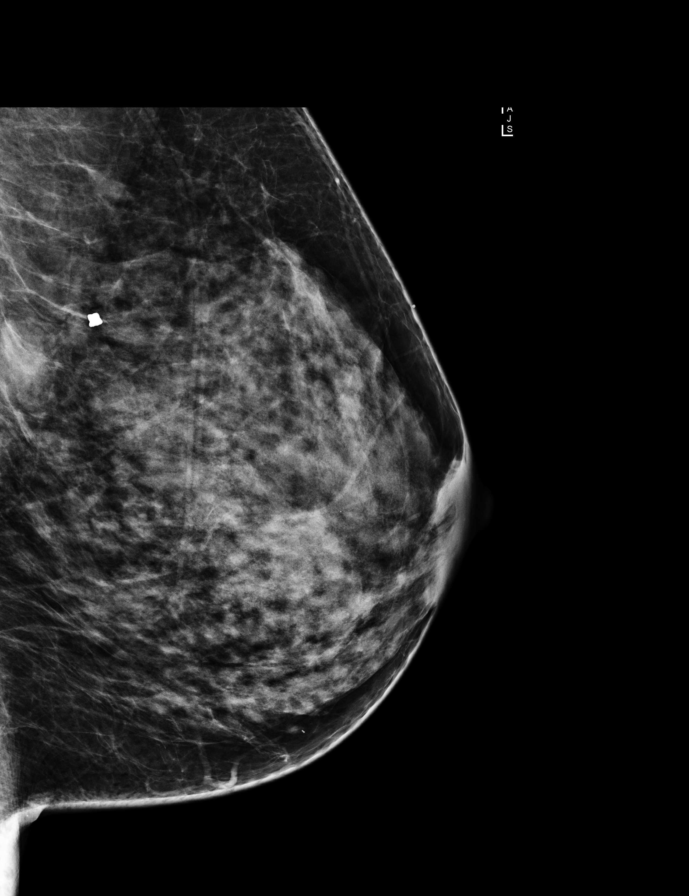

[L CC synth-2D]
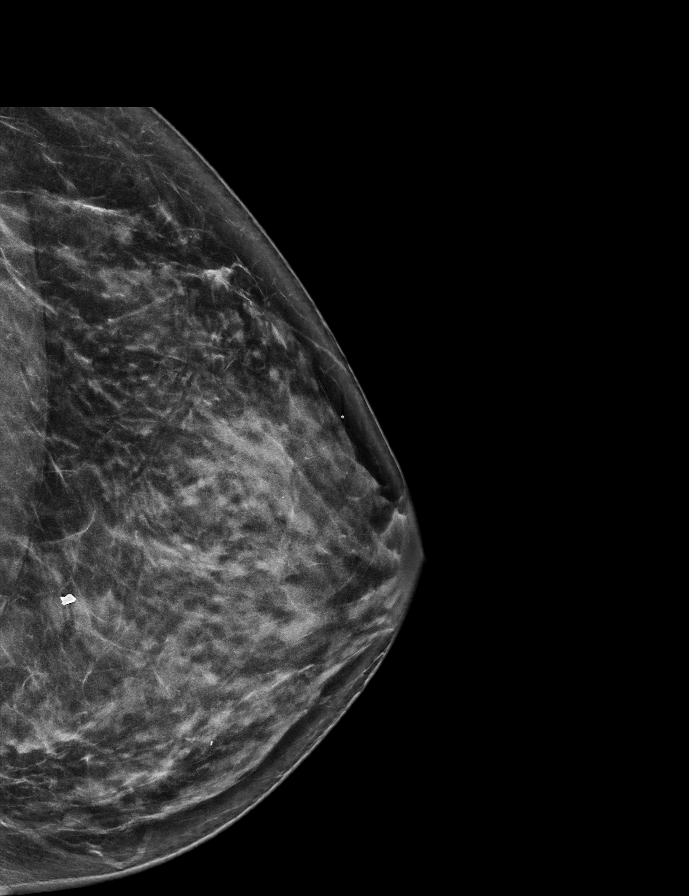

[L MLO tomo · tomo slice 31/62.0]
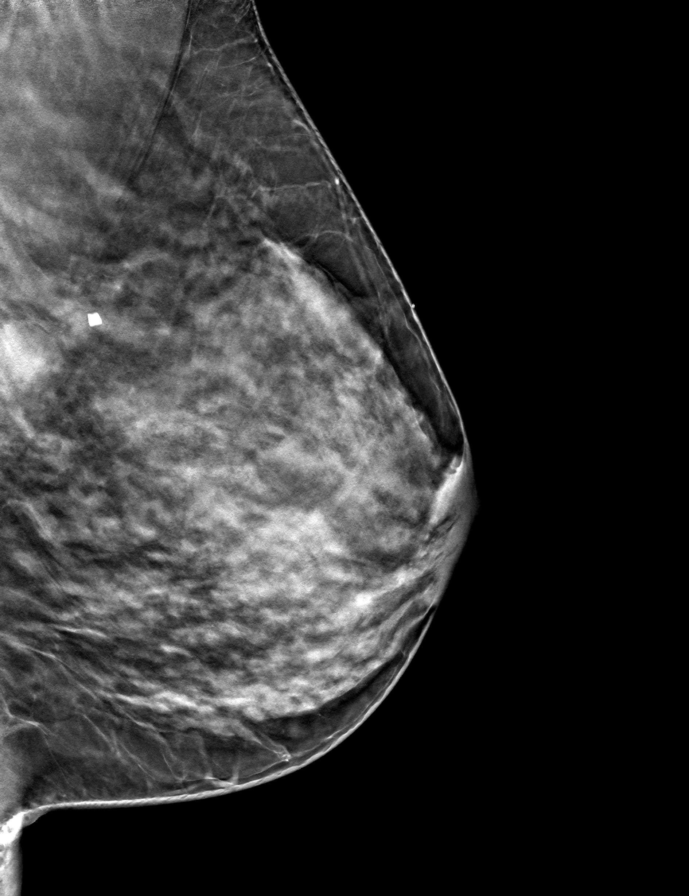

[L CC tomo · tomo slice 33/66.0]
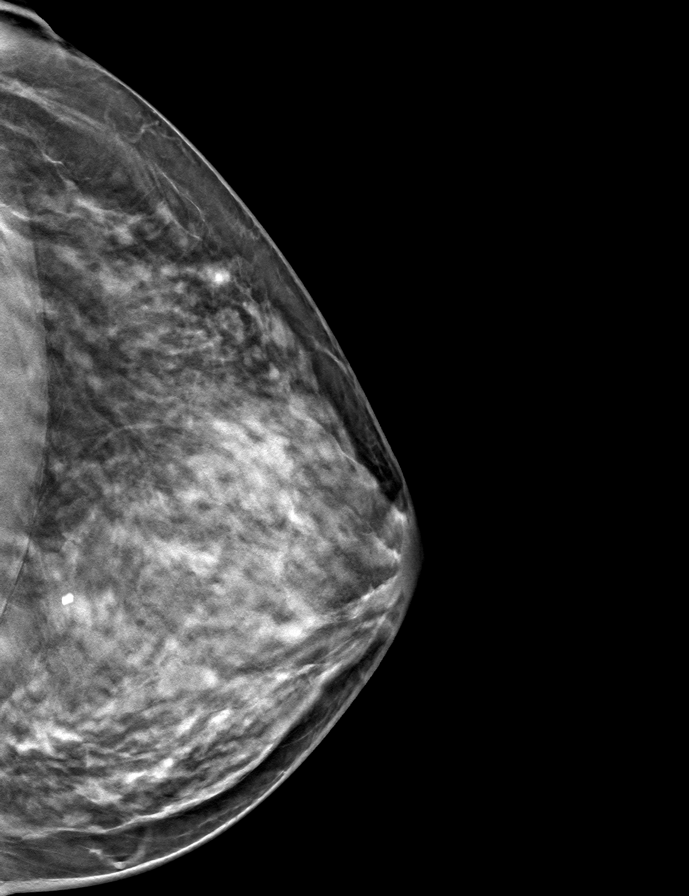

[L MLO (2 of 2)]
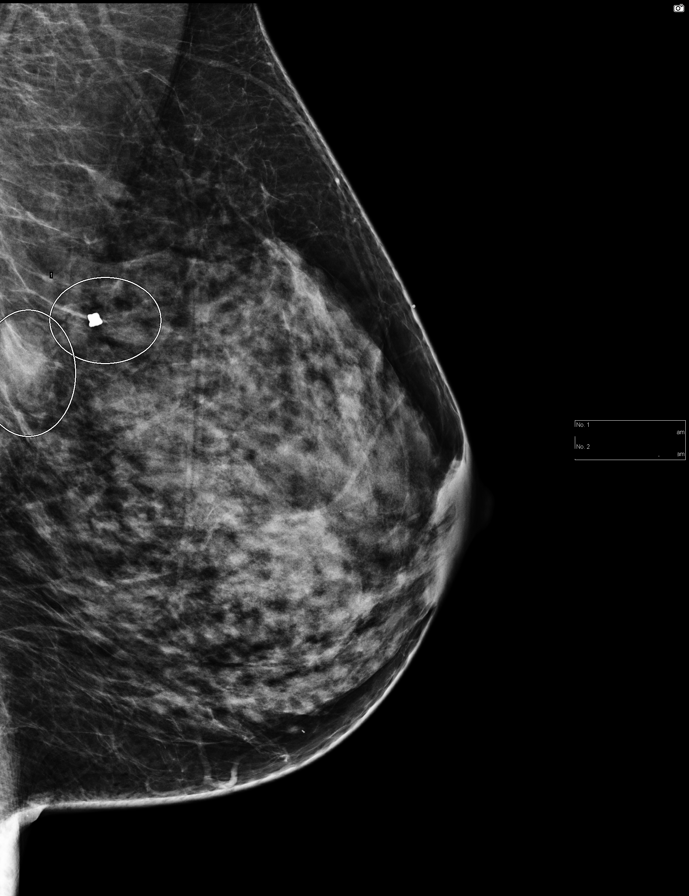

[L CC (2 of 2)]
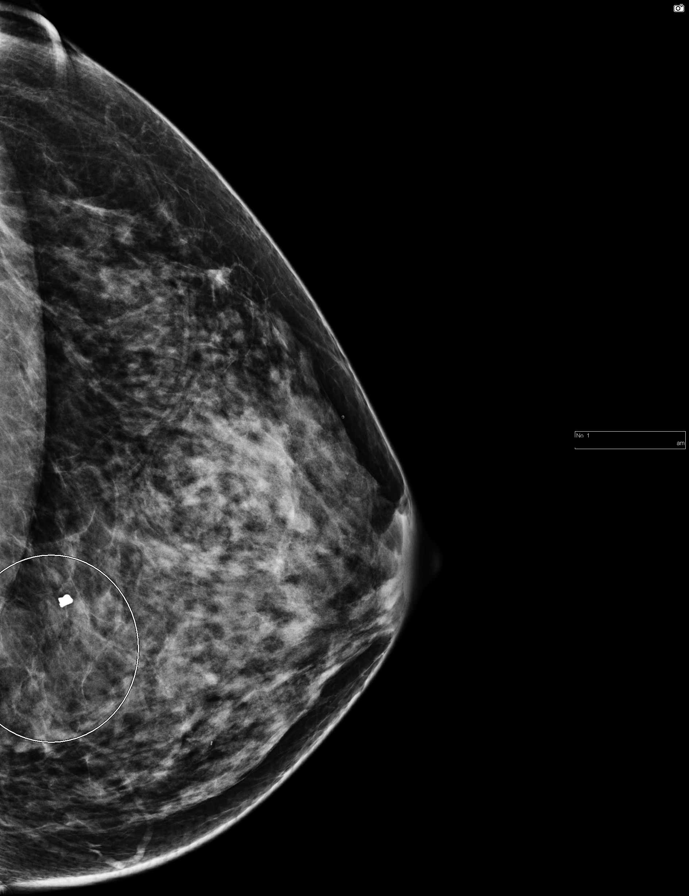

[8 of 16 positions shown; findings below may reference images not displayed]

ACR Breast Density Category c: The breast tissue is heterogeneously
dense, which may obscure small masses.
FINDINGS: Mammographically, there is an increased focal asymmetry in the left
breast upper inner quadrant, posterior depth.

Mammographic images were processed with CAD.

On physical exam, moderately firm round mass in the left breast
upper inner quadrant, posterior depth.

Targeted ultrasound is performed, showing left breast 11 o'clock 5
cm from the nipple partially obscured mass measuring 1.1 x 0.7 x
cm. In the left breast 11 o'clock 6 cm from the nipple, there is a
hypoechoic circumscribed mass measuring 1.8 x 1.1 x 1.7 cm. Both
masses are found to be more prominent than on the prior ultrasound.

There is no evidence of left axillary lymphadenopathy.
IMPRESSION: Two indeterminate left breast 11 o'clock masses, for which
ultrasound-guided core needle biopsy is recommended.

RECOMMENDATION:
Ultrasound-guided core needle biopsy of the left breast.

I have discussed the findings and recommendations with the patient.
Results were also provided in writing at the conclusion of the
visit. If applicable, a reminder letter will be sent to the patient
regarding the next appointment.

BI-RADS CATEGORY  4: Suspicious.

## 2018-03-21 NOTE — Patient Instructions (Signed)
Check blood sugars 1-2 x day before breakfast and 2 hrs after supper every day Bring blood sugar records to the next class  Exercise: Begin walking   for   15  minutes   3  days a week and gradually increase to 30 minutes 5 x week  Eat 3 meals day,   1-2 snacks a day Space meals 4-6 hours apart Allow 2-3 hours between meals and snacks Limit desserts/sweets  Make an eye doctor appointment  Carry fast acting glucose and a snack at all times Rotate injection sites Leave pens out of refrigerator when in use Change pen needles with each use  Return for classes on:

## 2018-03-21 NOTE — Progress Notes (Signed)
Diabetes Self-Management Education  Visit Type: First/Initial  Appt. Start Time: 1315 Appt. End Time: 1425  03/21/2018  Carol [name], identified by name and date of birth, is a 37 y.o. female with a diagnosis of Diabetes: Type 2.   ASSESSMENT  Blood pressure 120/86, height 5\' 4"  (1.626 m), weight 171 lb 9.6 oz (77.8 kg). Body mass index is 29.46 kg/m.  Diabetes Self-Management Education - 03/21/18 1435      Visit Information   Visit Type  First/Initial      Initial Visit   Diabetes Type  Type 2    Are you currently following a meal plan?  Yes    What type of meal plan do you follow?  pescretarian - eats fish but no other meat    Are you taking your medications as prescribed?  No Needs to refill Jardiance - hasn't taken in a few weeks    Date Diagnosed  2010      Health Coping   How would you rate your overall health?  Poor      Psychosocial Assessment   Patient Belief/Attitude about Diabetes  Motivated to manage diabetes "accepting"    Self-care barriers  None    Self-management support  Doctor's office;Family    Patient Concerns  Nutrition/Meal planning;Medication;Weight Control;Healthy Lifestyle    Special Needs  None    Preferred Learning Style  Auditory    Learning Readiness  Ready    How often do you need to have someone help you when you read instructions, pamphlets, or other written materials from your doctor or pharmacy?  1 - Never    What is the last grade level you completed in school?  Bachelors      Pre-Education Assessment   Patient understands the diabetes disease and treatment process.  Needs Review    Patient understands incorporating nutritional management into lifestyle.  Needs Instruction    Patient undertands incorporating physical activity into lifestyle.  Needs Instruction    Patient understands using medications safely.  Needs Instruction    Patient understands monitoring blood glucose, interpreting and using results  Needs Review    Patient  understands prevention, detection, and treatment of acute complications.  Needs Instruction    Patient understands prevention, detection, and treatment of chronic complications.  Needs Review    Patient understands how to develop strategies to address psychosocial issues.  Needs Instruction    Patient understands how to develop strategies to promote health/change behavior.  Needs Instruction      Complications   Last HgB A1C per patient/outside source  8.4 % 03/11/18    How often do you check your blood sugar?  1-2 times/day    Fasting Blood glucose range (mg/dL)  161-096;045-409;>811    Have you had a dilated eye exam in the past 12 months?  No    Have you had a dental exam in the past 12 months?  Yes    Are you checking your feet?  Yes    How many days per week are you checking your feet?  1      Dietary Intake   Breakfast  doesn't eat meat except for fish - grits, eggs, yogurt, fruit    Snack (morning)  cookies, crackers, coffee, hot tea    Lunch  Mediterranean salad, [place], tofu    Dinner  pasta, fish, potatoes, peas, beans, pasta, fruit - grapes, strawberries, banana, melon; non-starchy vegetables    Snack (evening)  8-9 and 10-11 pm = yogurt,  ice cream, popsicle; or sometimes she eats what she had for dinner    Beverage(s)  water, coffee, hot tea      Exercise   Exercise Type  ADL's      Patient Education   Previous Diabetes Education  No    Disease state   Definition of diabetes, type 1 and 2, and the diagnosis of diabetes;Explored patient's options for treatment of their diabetes    Nutrition management   Role of diet in the treatment of diabetes and the relationship between the three main macronutrients and blood glucose level;Reviewed blood glucose goals for pre and post meals and how to evaluate the patients' food intake on their blood glucose level.    Physical activity and exercise   Role of exercise on diabetes management, blood pressure control and cardiac health.     Medications  Taught/reviewed insulin injection, site rotation, insulin storage and needle disposal.;Reviewed patients medication for diabetes, action, purpose, timing of dose and side effects.    Monitoring  Purpose and frequency of SMBG.;Taught/discussed recording of test results and interpretation of SMBG.;Identified appropriate SMBG and/or A1C goals.    Acute complications  Taught treatment of hypoglycemia - the 15 rule.    Chronic complications  Relationship between chronic complications and blood glucose control;Retinopathy and reason for yearly dilated eye exams    Psychosocial adjustment  Identified and addressed patients feelings and concerns about diabetes      Individualized Goals (developed by patient)   Reducing Risk  Improve blood sugars Decrease medications Prevent diabetes complications Lose weight Lead a healthier lifestyle Become more fit     Outcomes   Expected Outcomes  Demonstrated interest in learning. Expect positive outcomes    Future DMSE  2 wks       Individualized Plan for Diabetes Self-Management Training:   Learning Objective:  Patient will have a greater understanding of diabetes self-management. Patient education plan is to attend individual and/or group sessions per assessed needs and concerns.   Plan:   Patient Instructions  Check blood sugars 1-2 x day before breakfast and 2 hrs after supper every day Bring blood sugar records to the next class Exercise: Begin walking   for   15  minutes   3  days a week and gradually increase to 30 minutes 5 x week Eat 3 meals day,   1-2 snacks a day Space meals 4-6 hours apart Allow 2-3 hours between meals and snacks Limit desserts/sweets Make an eye doctor appointment Carry fast acting glucose and a snack at all times Rotate injection sites Leave pens out of refrigerator when in use Change pen needles with each use   Expected Outcomes:  Demonstrated interest in learning. Expect positive  outcomes  Education material provided: General Meal Planning Guidelines Simple Meal Plan Glucose tablets Symptoms, causes and treatments of Hypoglycemia  If problems or questions, patient to contact team via:  Sharion SettlerSheila Shotwell, RN, CCM, CDE [phone]  Future DSME appointment: 2 wks  April 08, 2018 for Diabetes Class 1

## 2018-04-08 ENCOUNTER — Encounter: Payer: BLUE CROSS/BLUE SHIELD | Admitting: Dietician

## 2018-04-08 ENCOUNTER — Encounter: Payer: Self-pay | Admitting: Dietician

## 2018-04-08 VITALS — Ht 64.0 in | Wt 171.4 lb

## 2018-04-08 DIAGNOSIS — E119 Type 2 diabetes mellitus without complications: Secondary | ICD-10-CM

## 2018-04-08 DIAGNOSIS — E118 Type 2 diabetes mellitus with unspecified complications: Secondary | ICD-10-CM | POA: Diagnosis not present

## 2018-04-08 DIAGNOSIS — Z713 Dietary counseling and surveillance: Secondary | ICD-10-CM | POA: Diagnosis not present

## 2018-04-08 DIAGNOSIS — Z794 Long term (current) use of insulin: Secondary | ICD-10-CM | POA: Diagnosis not present

## 2018-04-08 NOTE — Progress Notes (Signed)

## 2018-04-15 ENCOUNTER — Encounter: Payer: Self-pay | Admitting: *Deleted

## 2018-04-15 ENCOUNTER — Encounter: Payer: BLUE CROSS/BLUE SHIELD | Admitting: *Deleted

## 2018-04-15 VITALS — Wt 169.9 lb

## 2018-04-15 DIAGNOSIS — Z794 Long term (current) use of insulin: Secondary | ICD-10-CM | POA: Diagnosis not present

## 2018-04-15 DIAGNOSIS — E119 Type 2 diabetes mellitus without complications: Secondary | ICD-10-CM

## 2018-04-15 DIAGNOSIS — E118 Type 2 diabetes mellitus with unspecified complications: Secondary | ICD-10-CM | POA: Diagnosis not present

## 2018-04-15 DIAGNOSIS — Z713 Dietary counseling and surveillance: Secondary | ICD-10-CM | POA: Diagnosis not present

## 2018-04-15 NOTE — Progress Notes (Signed)

## 2018-04-22 ENCOUNTER — Encounter: Payer: Self-pay | Admitting: Dietician

## 2018-04-22 ENCOUNTER — Encounter: Payer: BLUE CROSS/BLUE SHIELD | Attending: Family | Admitting: Dietician

## 2018-04-22 VITALS — BP 146/94 | Ht 64.0 in | Wt 169.9 lb

## 2018-04-22 DIAGNOSIS — E118 Type 2 diabetes mellitus with unspecified complications: Secondary | ICD-10-CM | POA: Diagnosis not present

## 2018-04-22 DIAGNOSIS — Z794 Long term (current) use of insulin: Secondary | ICD-10-CM | POA: Diagnosis not present

## 2018-04-22 DIAGNOSIS — Z713 Dietary counseling and surveillance: Secondary | ICD-10-CM | POA: Insufficient documentation

## 2018-04-22 DIAGNOSIS — E119 Type 2 diabetes mellitus without complications: Secondary | ICD-10-CM

## 2018-04-22 LAB — HM DIABETES EYE EXAM

## 2018-04-22 NOTE — Progress Notes (Signed)
Appt. Start Time: 1730 Appt. End Time: 2030  Patient reports her dog jammed her left ring finger prior to leaving home for this class. RN noted swelling of left ring finger and top of left hand. RN provided ice pack to patient for her hand.   Class 3 Diabetes Overview - identify functions of pancreas and insulin; define insulin deficiency vs insulin resistance  Medications - state name, dose, timing of currently prescribed medications; describe types of medications available for diabetes  Psychosocial - identify DM as a source of stress; state the effects of stress on BG control; verbalize appropriate stress management techniques; identify personal stress issues   Nutritional Management - use food labels to identify serving size, content of carbohydrate, fiber, protein, fat, saturated fat and sodium; recognize food sources of fat, saturated fat, trans fat, and sodium, and verbalize goals for intake; describe healthful, appropriate food choices when dining out   Exercise - state a plan for personal exercise; verbalize contraindications for exercise  Self-Monitoring - state importance of SMBG; use SMBG results to effectively manage diabetes; identify importance of regular HbA1C testing and goals for results  Acute Complications - recognize hyperglycemia and hypoglycemia with causes and effects; identify blood glucose results as high, low or in control; list steps in treating and preventing high and low blood glucose  Chronic Complications - state importance of daily self-foot exams; explain appropriate eye and dental care  Lifestyle Changes/Goals & Health/Community Resources - set goals for proper diabetes care; state need for and frequency of healthcare follow-up; describe appropriate community resources for good health (ADA, web sites, apps)   Teaching Materials Used: Class 3 Slide Packet Diabetes Stress Test Stress Management Tools Stress Poem Goal Setting Worksheet Website/App  List

## 2018-04-26 ENCOUNTER — Encounter: Payer: Self-pay | Admitting: *Deleted

## 2018-04-28 ENCOUNTER — Telehealth: Payer: Self-pay | Admitting: Family

## 2018-04-28 NOTE — Telephone Encounter (Signed)
Call pt Received note from nutrition and she mentioned a screening regarding sleep apnea.   I dont believe patient has ever been tested.   Please offer sleep study if patient snores or has frequent HA, fatigue.Marland Kitchen

## 2018-04-30 NOTE — Telephone Encounter (Signed)
Left voice mail to call back ok for [facility] to speak to patient 

## 2018-05-01 ENCOUNTER — Telehealth: Payer: Self-pay

## 2018-05-01 NOTE — Telephone Encounter (Signed)
Patient returned call to [facility] , she has never had sleep study. See other message for documentation

## 2018-05-01 NOTE — Telephone Encounter (Signed)
Copied from CRM [id number]. Topic: Quick Communication - See Telephone Encounter >> Apr 30, 2018  3:52 PM Bare, Hale Drone, CMA wrote: CRM for notification. See Telephone encounter for: 04/30/18.  >> Apr 30, 2018  5:21 PM Mickel Baas B, [place] wrote: Patient returning call. Was unsure what it was about. Asked her if she had ever had a sleep apnea study, patient answered no. Stated that she did not mention this at her nutrition appointment and was not sure why this was brought up.

## 2018-05-01 NOTE — Telephone Encounter (Signed)
Call pt  Nutrition did a screen which is was there regarding sleep apnea. Sounds like patient was unaware of this. She scored high on score and nutritions note to me stated this.  I wanted to be thorough and offer testing if patient has any concerns of OSA.   If she would like to dicuss, please set up OV

## 2018-05-01 NOTE — Telephone Encounter (Signed)
Duplicate I sent note to kristen regarding this

## 2018-05-01 NOTE — Telephone Encounter (Signed)
Left voice mail to call back ok for [facility] to speak to patient 

## 2018-05-03 ENCOUNTER — Encounter: Payer: Self-pay | Admitting: Family

## 2018-05-05 DIAGNOSIS — R05 Cough: Secondary | ICD-10-CM | POA: Diagnosis not present

## 2018-05-05 DIAGNOSIS — R0982 Postnasal drip: Secondary | ICD-10-CM | POA: Diagnosis not present

## 2018-05-06 ENCOUNTER — Ambulatory Visit: Payer: Self-pay

## 2018-05-06 ENCOUNTER — Other Ambulatory Visit: Payer: Self-pay | Admitting: Family Medicine

## 2018-05-06 ENCOUNTER — Telehealth: Payer: Self-pay

## 2018-05-06 DIAGNOSIS — E1100 Type 2 diabetes mellitus with hyperosmolarity without nonketotic hyperglycemic-hyperosmolar coma (NKHHC): Secondary | ICD-10-CM

## 2018-05-06 NOTE — Telephone Encounter (Signed)
Copied from CRM [id number]. Topic: Appointment Scheduling - Scheduling Inquiry for Clinic >> May 06, 2018 11:34 AM Carol [name], NT wrote: Patient is calling and states she had a hand injury and believes her knuckles are dislocated. She states she was speaking with Arnett on MyChart and was told to make an appt with her. Jason Coop is not available and the office is full today. Patient would like to know can she be fit in and get this looked at. Patient is scheduled to see Madilyn Fireman. On 05-07-18.

## 2018-05-06 NOTE — Telephone Encounter (Signed)
Patient will schedule office visit to come in to discuss

## 2018-05-07 ENCOUNTER — Ambulatory Visit: Payer: Self-pay | Admitting: Family Medicine

## 2018-05-07 ENCOUNTER — Encounter: Payer: Self-pay | Admitting: Family Medicine

## 2018-05-07 ENCOUNTER — Ambulatory Visit: Payer: BLUE CROSS/BLUE SHIELD | Admitting: Family Medicine

## 2018-05-07 ENCOUNTER — Ambulatory Visit (INDEPENDENT_AMBULATORY_CARE_PROVIDER_SITE_OTHER): Payer: BLUE CROSS/BLUE SHIELD

## 2018-05-07 VITALS — BP 124/82 | HR 88 | Temp 98.6°F | Ht 64.0 in | Wt 167.8 lb

## 2018-05-07 DIAGNOSIS — Z3202 Encounter for pregnancy test, result negative: Secondary | ICD-10-CM | POA: Diagnosis not present

## 2018-05-07 DIAGNOSIS — M79642 Pain in left hand: Secondary | ICD-10-CM

## 2018-05-07 DIAGNOSIS — S62325A Displaced fracture of shaft of fourth metacarpal bone, left hand, initial encounter for closed fracture: Secondary | ICD-10-CM | POA: Diagnosis not present

## 2018-05-07 LAB — POCT URINE PREGNANCY: Preg Test, Ur: NEGATIVE

## 2018-05-07 NOTE — Progress Notes (Signed)
Patient ID: Carol [name], female    DOB: 1981-11-19, 37 y.o.   MRN: [id number]  HPI  [name] is a 37 year old female who presents today with left hand pain and swelling that started one week ago after her dog bumped into her hand and injured her left hand and  her 4th finger. After injury, she had pain and swelling that occurred immediately but swelling has improved with ice. She also reports having a bruise on her finger that has improved and is no longer present. She is able to pick up items but reports having difficulty lifting her 4th finger. She was seen by a provider at her work and patient reports that evaluation did not initially reveal fracture. She was advised to seek care if symptoms did not improve or swelling persisted. Associated pain with palpation is present today. She denies numbness, tingling, weakness, erythema, warmth, swelling, or bruising today.  Pain is aggravated with palpation and movement. Alleviated by rest and ice Right hand is her dominant hand. History of T2DM and she denies polyuria, polyphagia, or polydipsia. She is scheduled for follow up in June 2019 and reports she is adherent with medications and has received benefit from diabetes teaching that was recommended by her PCP.   Review of Systems  Constitutional: Negative for chills and fever.  Respiratory: Negative for cough, shortness of breath and wheezing.   Cardiovascular: Negative for chest pain and palpitations.  Gastrointestinal: Negative for nausea and vomiting.  Musculoskeletal: Negative for joint swelling.       Left hand/left 4th finger pain  Skin: Negative for rash.  Neurological: Negative for weakness.       Past Medical History:  Diagnosis Date  . Attention deficit disorder (ADD)   . Diabetes mellitus without complication (HCC) 2009  . Hypertension   . Subglottic stenosis    Trach collar     Social History   Socioeconomic History  . Marital status: Married    Spouse name:  Not on file  . Number of children: Not on file  . Years of education: Not on file  . Highest education level: Not on file  Occupational History  . Not on file  Social Needs  . Financial resource strain: Not on file  . Food insecurity:    Worry: Not on file    Inability: Not on file  . Transportation needs:    Medical: Not on file    Non-medical: Not on file  Tobacco Use  . Smoking status: Former Smoker    Packs/day: 0.25    Years: 3.00    Pack years: 0.75    Types: Cigarettes    Last attempt to quit: 02/17/2002    Years since quitting: 16.2  . Smokeless tobacco: Never Used  Substance and Sexual Activity  . Alcohol use: Yes    Alcohol/week: 0.0 oz    Comment: Rare - 2  year  . Drug use: No  . Sexual activity: Yes    Partners: Male  Lifestyle  . Physical activity:    Days per week: Not on file    Minutes per session: Not on file  . Stress: Not on file  Relationships  . Social connections:    Talks on phone: Not on file    Gets together: Not on file    Attends religious service: Not on file    Active member of club or organization: Not on file    Attends meetings of clubs or  organizations: Not on file    Relationship status: Not on file  . Intimate partner violence:    Fear of current or ex partner: Not on file    Emotionally abused: Not on file    Physically abused: Not on file    Forced sexual activity: Not on file  Other Topics Concern  . Not on file  Social History Narrative   Married   Employed as a Copywriter, advertising degree    Caffeine- Hot tea and coffee- 1 cup    No children     Past Surgical History:  Procedure Laterality Date  . BREAST BIOPSY Right   . TRACHEOSTOMY  2010    Family History  Problem Relation Age of Onset  . Diabetes Father   . Hypertension Father   . Hypertension Brother   . Breast cancer Neg Hx     Allergies  Allergen Reactions  . [place] Nausea Only and Swelling    Gas, nausea, lip swelling    Current  Outpatient Medications on File Prior to Visit  Medication Sig Dispense Refill  . amLODipine (NORVASC) 10 MG tablet Take 1 tablet (10 mg total) by mouth daily. 90 tablet 2  . amphetamine-dextroamphetamine (ADDERALL XR) 20 MG 24 hr capsule Take 1 capsule (20 mg total) by mouth daily. 30 capsule 0  . empagliflozin (JARDIANCE) 25 MG TABS tablet Take 25 mg by mouth daily. 90 tablet 1  . glucose blood test strip Use as instructed up to 2 times per day 100 each 12  . Insulin Glargine (BASAGLAR KWIKPEN) 100 UNIT/ML SOPN Inject 0.5 mLs (50 Units total) into the skin daily. 15 mL 3  . liraglutide (VICTOZA) 18 MG/3ML SOPN Inject 0.1 mLs (0.6 mg total) into the skin daily. Start 0.6mg  [place] QD x 1 week, then 1.2mg  [place] qd. 3 pen 2  . losartan (COZAAR) 50 MG tablet Take 1 tablet (50 mg total) by mouth daily. 90 tablet 2  . rosuvastatin (CRESTOR) 20 MG tablet Take 1 tablet (20 mg total) by mouth daily. 90 tablet 2   No current facility-administered medications on file prior to visit.     BP 124/82 (BP Location: Left Arm, Patient Position: Sitting, Cuff Size: Normal)   Pulse 88   Temp 98.6 F (37 C) (Oral)   Ht  (1.626 m)   Wt 167 lb 12 oz (76.1 kg)   LMP 04/23/2018   SpO2 98%   BMI 28.79 kg/m    Objective:   Physical Exam  Constitutional: She is oriented to person, place, and time. She appears well-developed and well-nourished.  Eyes: No scleral icterus.  Neck: Neck supple.  Cardiovascular: Normal rate and normal heart sounds.  Pulmonary/Chest: Effort normal and breath sounds normal.  Trach present  Musculoskeletal:  No ecchymosis, erythema, or edema of left hand present. Grip strength 5/5 and equal bilaterally. Mild discomfort present with palpation over left 4th MCP. Fourth MCP appears to be shorter. Able to open and close hand and move individual fingers without difficulty or discomfort.  Lymphadenopathy:    She has no cervical adenopathy.  Neurological: She is alert and oriented to  person, place, and time.  Skin: Skin is warm and dry.  Psychiatric: She has a normal mood and affect. Her behavior is normal. Judgment and thought content normal.      Assessment & Plan:  1. Left hand pain History of trauma with discomfort present and appearance of shorter 4th MCP present; will obtain X-ray today  to determine further treatment. We discussed splinting the area. No available splints in office today; Patient was advised to obtain splint at pharmacy. Patient has been using her hand for one week prior to this evaluation. We discussed orthopedic options for care. Patient voiced understanding of care options and will determine next steps based on imaging. Return precautions provided.  - DG Hand Complete Left; Future  2. Negative pregnancy test Performed prior to imaging.  - POCT urine pregnancy  Roddie Mc, FNP-C

## 2018-05-07 NOTE — Patient Instructions (Signed)
Please go to X-ray before you leave. Results will be called to you.   Hand Pain Many things can cause hand pain. Some common causes are:  An injury.  Repeating the same movement with your hand over and over (overuse).  Osteoporosis.  Arthritis.  Lumps in the tendons or joints of the hand and wrist (ganglion cysts).  Infection.  Follow these instructions at home: Pay attention to any changes in your symptoms. Take these actions to help with your discomfort:  If directed, put ice on the affected area: ? Put ice in a plastic bag. ? Place a towel between your skin and the bag. ? Leave the ice on for 15-20 minutes, 3?4 times a day for 2 days.  Take over-the-counter and prescription medicines only as told by your health care provider.  Minimize stress on your hands and wrists as much as possible.  Take breaks from repetitive activity often.  Do stretches as told by your health care provider.  Do not do activities that make your pain worse.  Contact a health care provider if:  Your pain does not get better after a few days of self-care.  Your pain gets worse.  Your pain affects your ability to do your daily activities. Get help right away if:  Your hand becomes warm, red, or swollen.  Your hand is numb or tingling.  Your hand is extremely swollen or deformed.  Your hand or fingers turn white or blue.  You cannot move your hand, wrist, or fingers. This information is not intended to replace advice given to you by your health care provider. Make sure you discuss any questions you have with your health care provider. Document Released: 12/31/2015 Document Revised: 05/11/2016 Document Reviewed: 12/30/2014 Elsevier Interactive Patient Education  [employer].

## 2018-05-08 NOTE — Telephone Encounter (Signed)
Last filled 04/30/17 Last office visit 03/11/18 No office visit scheduled

## 2018-05-10 ENCOUNTER — Encounter: Payer: Self-pay | Admitting: Family

## 2018-05-10 ENCOUNTER — Other Ambulatory Visit: Payer: Self-pay | Admitting: *Deleted

## 2018-05-10 DIAGNOSIS — Z794 Long term (current) use of insulin: Secondary | ICD-10-CM

## 2018-05-10 DIAGNOSIS — S62325A Displaced fracture of shaft of fourth metacarpal bone, left hand, initial encounter for closed fracture: Secondary | ICD-10-CM | POA: Diagnosis not present

## 2018-05-10 DIAGNOSIS — E1100 Type 2 diabetes mellitus with hyperosmolarity without nonketotic hyperglycemic-hyperosmolar coma (NKHHC): Secondary | ICD-10-CM

## 2018-05-10 DIAGNOSIS — E119 Type 2 diabetes mellitus without complications: Secondary | ICD-10-CM

## 2018-05-10 DIAGNOSIS — E785 Hyperlipidemia, unspecified: Secondary | ICD-10-CM

## 2018-05-10 MED ORDER — EMPAGLIFLOZIN 25 MG PO TABS
25.0000 mg | ORAL_TABLET | Freq: Every day | ORAL | 1 refills | Status: DC
Start: 2018-05-10 — End: 2020-02-13

## 2018-05-10 MED ORDER — BASAGLAR KWIKPEN 100 UNIT/ML ~~LOC~~ SOPN: 50.0000 [IU] | PEN_INJECTOR | Freq: Every day | SUBCUTANEOUS | 1 refills | Status: DC

## 2018-05-10 MED ORDER — ROSUVASTATIN CALCIUM 20 MG PO TABS: 20.0000 mg | ORAL_TABLET | Freq: Every day | ORAL | 2 refills | Status: DC

## 2018-05-10 NOTE — Progress Notes (Signed)
Medication have been refilled and sent to [facility] Carol [name]

## 2018-05-20 ENCOUNTER — Ambulatory Visit: Payer: Self-pay

## 2018-05-24 DIAGNOSIS — S62325A Displaced fracture of shaft of fourth metacarpal bone, left hand, initial encounter for closed fracture: Secondary | ICD-10-CM | POA: Diagnosis not present

## 2018-06-14 DIAGNOSIS — S62325D Displaced fracture of shaft of fourth metacarpal bone, left hand, subsequent encounter for fracture with routine healing: Secondary | ICD-10-CM | POA: Diagnosis not present

## 2018-07-08 ENCOUNTER — Other Ambulatory Visit: Payer: Self-pay | Admitting: Family

## 2018-07-08 DIAGNOSIS — F909 Attention-deficit hyperactivity disorder, unspecified type: Secondary | ICD-10-CM

## 2018-07-09 NOTE — Telephone Encounter (Signed)
lov 03/11/18 Nov 07/22/18

## 2018-07-10 MED ORDER — AMPHETAMINE-DEXTROAMPHET ER 20 MG PO CP24: 20.0000 mg | ORAL_CAPSULE | Freq: Every day | ORAL | 0 refills | Status: DC

## 2018-07-10 NOTE — Telephone Encounter (Signed)
Call pt I have refilled for 3 months for adderall- sent electronically  Please education patient. This is controlled substance;  In order for me to prescribe medication,  Patient must be seen every 3-6 months. please make follow-up appointment.  I looked up patient on [place] Controlled Substances Reporting System and saw no activity that raised concern of inappropriate use.

## 2018-07-11 NOTE — Telephone Encounter (Signed)
Appointment 07/22/18

## 2018-07-22 ENCOUNTER — Ambulatory Visit: Payer: BLUE CROSS/BLUE SHIELD | Admitting: Family

## 2018-07-24 ENCOUNTER — Ambulatory Visit: Payer: BLUE CROSS/BLUE SHIELD | Admitting: Family

## 2018-08-02 ENCOUNTER — Encounter: Payer: Self-pay | Admitting: Family

## 2018-08-02 ENCOUNTER — Ambulatory Visit: Payer: BLUE CROSS/BLUE SHIELD | Admitting: Family

## 2018-08-02 VITALS — BP 150/102 | HR 83 | Temp 98.2°F | Resp 15 | Wt 174.0 lb

## 2018-08-02 DIAGNOSIS — E119 Type 2 diabetes mellitus without complications: Secondary | ICD-10-CM

## 2018-08-02 DIAGNOSIS — I10 Essential (primary) hypertension: Secondary | ICD-10-CM

## 2018-08-02 DIAGNOSIS — F419 Anxiety disorder, unspecified: Secondary | ICD-10-CM

## 2018-08-02 DIAGNOSIS — F329 Major depressive disorder, single episode, unspecified: Secondary | ICD-10-CM

## 2018-08-02 DIAGNOSIS — L989 Disorder of the skin and subcutaneous tissue, unspecified: Secondary | ICD-10-CM

## 2018-08-02 DIAGNOSIS — Z794 Long term (current) use of insulin: Secondary | ICD-10-CM

## 2018-08-02 DIAGNOSIS — F909 Attention-deficit hyperactivity disorder, unspecified type: Secondary | ICD-10-CM | POA: Diagnosis not present

## 2018-08-02 MED ORDER — AMLODIPINE BESYLATE 10 MG PO TABS: 10.0000 mg | ORAL_TABLET | Freq: Every day | ORAL | 2 refills | Status: DC

## 2018-08-02 MED ORDER — AMPHETAMINE-DEXTROAMPHET ER 20 MG PO CP24: 20.0000 mg | ORAL_CAPSULE | Freq: Every day | ORAL | 0 refills | Status: DC

## 2018-08-02 MED ORDER — SERTRALINE HCL 50 MG PO TABS: 50.0000 mg | ORAL_TABLET | Freq: Every day | ORAL | 3 refills | Status: DC

## 2018-08-02 MED ORDER — LOSARTAN POTASSIUM 50 MG PO TABS: 50.0000 mg | ORAL_TABLET | Freq: Every day | ORAL | 2 refills | Status: DC

## 2018-08-02 NOTE — Assessment & Plan Note (Signed)
-   Referral to dermatology for further evaluation

## 2018-08-02 NOTE — Assessment & Plan Note (Signed)
New.  Has been on Zoloft in the past and done well.  Will start low-dose.  Close follow-up

## 2018-08-02 NOTE — Patient Instructions (Addendum)
Monitor blood pressure, let me know if still running high today after taking medications.     Goal is less than 130/80; if persistently higher, please make sooner follow up appointment so we can recheck you blood pressure and manage medications  Trial zoloft.   Labs today  Today we discussed referrals, orders. DERMATOLOGY   I have placed these orders in the system for you.  Please be sure to give us a call if you have not heard from our office regarding scheduling a test or regarding referral in a timely manner.  It is very important that you let me know as soon as possible.

## 2018-08-02 NOTE — Assessment & Plan Note (Signed)
Suspect uncontrolled.  Pending A1c 

## 2018-08-02 NOTE — Progress Notes (Signed)
Subjective:    Patient ID: Carol [name], female    DOB: December 28, 1980, 37 y.o.   MRN: [id number]  CC: Carol [name] is a 37 y.o. female who presents today for follow up.   HPI: HTN- didn't take losartan, amlodipine last night. Doesn't check at home. Denies exertional chest pain or pressure, numbness or tingling radiating to left arm or jaw, palpitations, dizziness, frequent headaches, changes in vision, or shortness of breath.    DM-basaglar 50 qam; had done well a couple of months ago, had a URI at that time and since then not following a good diet. . No hypoglycemic episodes. FBG 180- 200.   ADHD- needs refill. Doing well on current dose however not working as well as it was. No trouble sleeping.   At work has had some stress. Does note anxiety as well. Had been on zoloft in past in college and worked well. No si/hi.   Doesn't snore. Feels restored from sleep. Gets about 4 -5 hours per sleep.     HISTORY:  Past Medical History:  Diagnosis Date  . Attention deficit disorder (ADD)   . Diabetes mellitus without complication (HCC) 2009  . Hypertension   . Subglottic stenosis    Trach collar   Past Surgical History:  Procedure Laterality Date  . BREAST BIOPSY Right   . TRACHEOSTOMY  2010   Family History  Problem Relation Age of Onset  . Diabetes Father   . Hypertension Father   . Hypertension Brother   . Breast cancer Neg Hx     Allergies: Lac bovis Current Outpatient Medications on File Prior to Visit  Medication Sig Dispense Refill  . empagliflozin (JARDIANCE) 25 MG TABS tablet Take 25 mg by mouth daily. 90 tablet 1  . glucose blood test strip Use as instructed up to 2 times per day 100 each 12  . Insulin Glargine (BASAGLAR KWIKPEN) 100 UNIT/ML SOPN Inject 0.5 mLs (50 Units total) into the skin daily at 10 pm. 15 pen 1  . liraglutide (VICTOZA) 18 MG/3ML SOPN Inject 0.1 mLs (0.6 mg total) into the skin daily. Start 0.6mg  [place] QD x 1 week, then 1.2mg  [place] qd. 3 pen 2    . rosuvastatin (CRESTOR) 20 MG tablet Take 1 tablet (20 mg total) by mouth daily. 90 tablet 2   No current facility-administered medications on file prior to visit.     Social History   Tobacco Use  . Smoking status: Former Smoker    Packs/day: 0.25    Years: 3.00    Pack years: 0.75    Types: Cigarettes    Last attempt to quit: 02/17/2002    Years since quitting: 16.4  . Smokeless tobacco: Never Used  Substance Use Topics  . Alcohol use: Yes    Alcohol/week: 0.0 standard drinks    Comment: Rare - 2  year  . Drug use: No    Review of Systems  Constitutional: Negative for chills and fever.  Respiratory: Negative for cough.   Cardiovascular: Negative for chest pain and palpitations.  Gastrointestinal: Negative for nausea and vomiting.  Psychiatric/Behavioral: Positive for decreased concentration. Negative for sleep disturbance and suicidal ideas. The patient is nervous/anxious.       Objective:    BP (!) 150/102 (BP Location: Left Arm, Patient Position: Sitting, Cuff Size: Normal) Comment: hasn't taken blood pressure medication  Pulse 83   Temp 98.2 F (36.8 C) (Oral)   Resp 15   Wt 174 lb (78.9 kg)  SpO2 99%   BMI 29.87 kg/m  BP Readings from Last 3 Encounters:  08/02/18 (!) 150/102  05/07/18 124/82  04/22/18 (!) 146/94   Wt Readings from Last 3 Encounters:  08/02/18 174 lb (78.9 kg)  05/07/18 167 lb 12 oz (76.1 kg)  04/22/18 169 lb 14.4 oz (77.1 kg)    Physical Exam  Constitutional: She appears well-developed and well-nourished.  Eyes: Conjunctivae are normal.  Cardiovascular: Normal rate, regular rhythm, normal heart sounds and normal pulses.  Pulmonary/Chest: Effort normal and breath sounds normal. She has no wheezes. She has no rhonchi. She has no rales.  Neurological: She is alert.  Skin: Skin is warm and dry.  Large 4 cm asymmetric patch noted right dorsal aspect of foot.  Psychiatric: She has a normal mood and affect. Her speech is normal and  behavior is normal. Thought content normal.  Vitals reviewed.      Assessment & Plan:   Problem List Items Addressed This Visit      Cardiovascular and Mediastinum   Benign essential HTN    Elevated today however patient has not taken blood pressure medications.  She will check blood pressure when she gets home after she is taken blood pressure medications.  Also given her blood pressure prescription for a blood pressure cuff. Will follow      Relevant Medications   losartan (COZAAR) 50 MG tablet   amLODipine (NORVASC) 10 MG tablet   Other Relevant Orders   DME Other see comment   Lipid panel   Comprehensive metabolic panel     Endocrine   DM type 2 (diabetes mellitus, type 2) (HCC)    Suspect uncontrolled.  Pending A1c      Relevant Medications   losartan (COZAAR) 50 MG tablet   Other Relevant Orders   Hemoglobin A1c     Musculoskeletal and Integument   Skin lesion    Referral to dermatology for further evaluation.      Relevant Orders   Ambulatory referral to Dermatology     Other   ADHD (attention deficit hyperactivity disorder)    Largely controlled.  Discussed with patient that her decreasing focus of late may be depression, anxiety.  Will maintain on current Adderall dose at this time, and go ahead and start Zoloft.  Will follow  I looked up patient on [place] Controlled Substances Reporting System and saw no activity that raised concern of inappropriate use.        Relevant Medications   amphetamine-dextroamphetamine (ADDERALL XR) 20 MG 24 hr capsule   Anxiety and depression - Primary    New.  Has been on Zoloft in the past and done well.  Will start low-dose.  Close follow-up      Relevant Medications   sertraline (ZOLOFT) 50 MG tablet       I am having Carol [name] start on sertraline. I am also having her maintain her glucose blood, liraglutide, rosuvastatin, empagliflozin, BASAGLAR KWIKPEN, losartan, amLODipine, and  amphetamine-dextroamphetamine.   Meds ordered this encounter  Medications  . DISCONTD: amphetamine-dextroamphetamine (ADDERALL XR) 20 MG 24 hr capsule    Sig: Take 1 capsule (20 mg total) by mouth daily.    Dispense:  30 capsule    Refill:  0  . losartan (COZAAR) 50 MG tablet    Sig: Take 1 tablet (50 mg total) by mouth daily.    Dispense:  90 tablet    Refill:  2  . amLODipine (NORVASC) 10 MG tablet  Sig: Take 1 tablet (10 mg total) by mouth daily.    Dispense:  90 tablet    Refill:  2  . sertraline (ZOLOFT) 50 MG tablet    Sig: Take 1 tablet (50 mg total) by mouth at bedtime.    Dispense:  90 tablet    Refill:  3    Order Specific Question:   Supervising Provider    Answer:   Duncan DullULLO, TERESA L [[id number]]  . DISCONTD: amphetamine-dextroamphetamine (ADDERALL XR) 20 MG 24 hr capsule    Sig: Take 1 capsule (20 mg total) by mouth daily.    Dispense:  30 capsule    Refill:  0    Fill on or after 09/02/18    Order Specific Question:   Supervising Provider    Answer:   Duncan DullULLO, TERESA L [[id number]]  . amphetamine-dextroamphetamine (ADDERALL XR) 20 MG 24 hr capsule    Sig: Take 1 capsule (20 mg total) by mouth daily.    Dispense:  30 capsule    Refill:  0    Fill on or after 10/02/18    Order Specific Question:   Supervising Provider    Answer:   Sherlene ShamsULLO, TERESA L [[id number]]    Return precautions given.   Risks, benefits, and alternatives of the medications and treatment plan prescribed today were discussed, and patient expressed understanding.   Education regarding symptom management and diagnosis given to patient on AVS.  Continue to follow with Allegra GranaArnett, Margaret G, FNP for routine health maintenance.   Carol [name] and I agreed with plan.   Rennie PlowmanMargaret Arnett, FNP

## 2018-08-02 NOTE — Assessment & Plan Note (Signed)
Largely controlled.  Discussed with patient that her decreasing focus of late may be depression, anxiety.  Will maintain on current Adderall dose at this time, and go ahead and start Zoloft.  Will follow  I looked up patient on [place] Controlled Substances Reporting System and saw no activity that raised concern of inappropriate use.

## 2018-08-02 NOTE — Assessment & Plan Note (Signed)
Elevated today however patient has not taken blood pressure medications.  She will check blood pressure when she gets home after she is taken blood pressure medications.  Also given her blood pressure prescription for a blood pressure cuff. Will follow

## 2018-08-04 ENCOUNTER — Emergency Department: Payer: BLUE CROSS/BLUE SHIELD

## 2018-08-04 ENCOUNTER — Encounter: Payer: Self-pay | Admitting: Emergency Medicine

## 2018-08-04 ENCOUNTER — Other Ambulatory Visit: Payer: Self-pay

## 2018-08-04 ENCOUNTER — Emergency Department
Admission: EM | Admit: 2018-08-04 | Discharge: 2018-08-04 | Disposition: A | Discharge status: Home / Self Care | Payer: BLUE CROSS/BLUE SHIELD | Attending: Student in an Organized Health Care Education/Training Program | Admitting: Student in an Organized Health Care Education/Training Program

## 2018-08-04 DIAGNOSIS — R42 Dizziness and giddiness: Secondary | ICD-10-CM | POA: Insufficient documentation

## 2018-08-04 DIAGNOSIS — R61 Generalized hyperhidrosis: Secondary | ICD-10-CM | POA: Insufficient documentation

## 2018-08-04 DIAGNOSIS — R55 Syncope and collapse: Secondary | ICD-10-CM | POA: Insufficient documentation

## 2018-08-04 DIAGNOSIS — E119 Type 2 diabetes mellitus without complications: Secondary | ICD-10-CM | POA: Insufficient documentation

## 2018-08-04 DIAGNOSIS — R110 Nausea: Secondary | ICD-10-CM | POA: Diagnosis not present

## 2018-08-04 DIAGNOSIS — Z794 Long term (current) use of insulin: Secondary | ICD-10-CM | POA: Insufficient documentation

## 2018-08-04 DIAGNOSIS — Z87891 Personal history of nicotine dependence: Secondary | ICD-10-CM | POA: Insufficient documentation

## 2018-08-04 DIAGNOSIS — I10 Essential (primary) hypertension: Secondary | ICD-10-CM | POA: Insufficient documentation

## 2018-08-04 DIAGNOSIS — Z79899 Other long term (current) drug therapy: Secondary | ICD-10-CM | POA: Insufficient documentation

## 2018-08-04 LAB — URINALYSIS, COMPLETE (UACMP) WITH MICROSCOPIC
BILIRUBIN URINE: NEGATIVE
Bacteria, UA: NONE SEEN
Glucose, UA: NEGATIVE mg/dL
HGB URINE DIPSTICK: NEGATIVE
Ketones, ur: NEGATIVE mg/dL
LEUKOCYTES UA: NEGATIVE
NITRITE: NEGATIVE
PH: 8 (ref 5.0–8.0)
Protein, ur: NEGATIVE mg/dL
SPECIFIC GRAVITY, URINE: 1.008 (ref 1.005–1.030)

## 2018-08-04 LAB — CBC
HCT: 37 % (ref 35.0–47.0)
Hemoglobin: 12.2 g/dL (ref 12.0–16.0)
MCH: 26.8 pg (ref 26.0–34.0)
MCHC: 33 g/dL (ref 32.0–36.0)
MCV: 81 fL (ref 80.0–100.0)
PLATELETS: 377 10*3/uL (ref 150–440)
RBC: 4.56 MIL/uL (ref 3.80–5.20)
RDW: 14 % (ref 11.5–14.5)
WBC: 8.3 10*3/uL (ref 3.6–11.0)

## 2018-08-04 LAB — COMPREHENSIVE METABOLIC PANEL
ALK PHOS: 68 U/L (ref 38–126)
ALT: 15 U/L (ref 0–44)
AST: 21 U/L (ref 15–41)
Albumin: 4.1 g/dL (ref 3.5–5.0)
Anion gap: 8 (ref 5–15)
BUN: 7 mg/dL (ref 6–20)
CALCIUM: 8.9 mg/dL (ref 8.9–10.3)
CO2: 28 mmol/L (ref 22–32)
CREATININE: 0.69 mg/dL (ref 0.44–1.00)
Chloride: 99 mmol/L (ref 98–111)
GFR calc non Af Amer: >60 mL/min (ref >60)
Glucose, Bld: 169 mg/dL — ABNORMAL HIGH (ref 70–99)
Potassium: 3.6 mmol/L (ref 3.5–5.1)
Sodium: 135 mmol/L (ref 135–145)
Total Bilirubin: 0.5 mg/dL (ref 0.3–1.2)
Total Protein: 8.5 g/dL — ABNORMAL HIGH (ref 6.5–8.1)

## 2018-08-04 LAB — GLUCOSE, CAPILLARY: Glucose-Capillary: 182 mg/dL — ABNORMAL HIGH (ref 70–99)

## 2018-08-04 LAB — TROPONIN I: Troponin I: <0.03 ng/mL (ref <0.03)

## 2018-08-04 LAB — PREGNANCY, URINE: PREG TEST UR: NEGATIVE

## 2018-08-04 MED ORDER — SODIUM CHLORIDE 0.9 % IV BOLUS
500.0000 mL | Freq: Once | INTRAVENOUS | Status: AC
  Administered 2018-08-04: 500 mL via INTRAVENOUS

## 2018-08-04 NOTE — ED Notes (Signed)
Provided pt with gingerale and sandwhich

## 2018-08-04 NOTE — ED Notes (Signed)
This RN made 2 unsuccessful attempts at PIV insertion. April to bedside to attempt PIV insertion.

## 2018-08-04 NOTE — ED Notes (Signed)
Patient reports near syncopal episode at home. Patient's husband lowered her down to floor. Patient reports similar episodes int he past due to hypoglycemia. Patient ate several pieces of candy and drank gingerale prior to EMS arrival. EMS CBG 208.

## 2018-08-04 NOTE — ED Provider Notes (Signed)
[facility] Emergency Department Provider Note    First MD Initiated Contact with Patient 08/04/18 0700     (approximate)  I have reviewed the triage vital signs and the nursing notes.   HISTORY  Chief Complaint Near Syncope    HPI Carol [name] is a 37 y.o. female the history of diabetes on Victoza as well as hypertension and subglottic stenosis status post tracheostomy presents to the ER for evaluation of near syncopal episode that occurred around 5:00 today.  Patient states that she was up and started feeling lightheaded and sweaty with a little bit of nausea associated with this.  Things then became black.  States that she was actually up going to get a ginger ale she felt like her blood sugar was dropping and this is happened several times in the past.  States that she is feeling lightheaded for about 5 to 10 minutes.  Denied any numbness or tingling.  No chest pain or palpitations.  EMS checked her and said that her CBG was 200 at that point and they recommend her be evaluated in the ER.  States that she feels well at this time.  She denies any weakness, shortness of breath, fatigue or discomfort with deep inspiration.  She denies any lower extremity swelling.    Past Medical History:  Diagnosis Date  . Attention deficit disorder (ADD)   . Diabetes mellitus without complication (HCC) 2009  . Hypertension   . Subglottic stenosis    Trach collar   Family History  Problem Relation Age of Onset  . Diabetes Father   . Hypertension Father   . Hypertension Brother   . Breast cancer Neg Hx    Past Surgical History:  Procedure Laterality Date  . BREAST BIOPSY Right   . TRACHEOSTOMY  2010   Patient Active Problem List   Diagnosis Date Noted  . Anxiety and depression 08/02/2018  . Skin lesion 03/11/2018  . Mass of upper inner quadrant of left breast 11/16/2017  . HLD (hyperlipidemia) 09/18/2017  . Breast lump in upper inner quadrant 02/27/2017    . Fluttering sensation of heart 02/27/2017  . ADHD (attention deficit hyperactivity disorder) 04/09/2016  . Subglottic stenosis 04/03/2016  . Benign essential HTN 04/03/2016  . DM type 2 (diabetes mellitus, type 2) (HCC) 04/03/2016  . Tracheostomy present (HCC) 08/26/2012      Prior to Admission medications   Medication Sig Start Date End Date Taking? Authorizing Provider  amLODipine (NORVASC) 10 MG tablet Take 1 tablet (10 mg total) by mouth daily. 08/02/18   Allegra GranaArnett, Margaret G, FNP  amphetamine-dextroamphetamine (ADDERALL XR) 20 MG 24 hr capsule Take 1 capsule (20 mg total) by mouth daily. 08/02/18   Allegra GranaArnett, Margaret G, FNP  empagliflozin (JARDIANCE) 25 MG TABS tablet Take 25 mg by mouth daily. 05/10/18   Allegra GranaArnett, Margaret G, FNP  glucose blood test strip Use as instructed up to 2 times per day 12/25/16   Tommie Samsook, Jayce G, DO  Insulin Glargine [facility](BASAGLAR KWIKPEN) 100 UNIT/ML SOPN Inject 0.5 mLs (50 Units total) into the skin daily at 10 pm. 05/10/18   Allegra GranaArnett, Margaret G, FNP  liraglutide (VICTOZA) 18 MG/3ML SOPN Inject 0.1 mLs (0.6 mg total) into the skin daily. Start 0.6mg  [place] QD x 1 week, then 1.2mg  [place] qd. 03/20/18   Allegra GranaArnett, Margaret G, FNP  losartan (COZAAR) 50 MG tablet Take 1 tablet (50 mg total) by mouth daily. 08/02/18   Allegra GranaArnett, Margaret G, FNP  rosuvastatin (CRESTOR) 20  MG tablet Take 1 tablet (20 mg total) by mouth daily. 05/10/18   Allegra Grana, FNP  sertraline (ZOLOFT) 50 MG tablet Take 1 tablet (50 mg total) by mouth at bedtime. 08/02/18   Allegra Grana, FNP    Allergies Lac bovis    Social History Social History   Tobacco Use  . Smoking status: Former Smoker    Packs/day: 0.25    Years: 3.00    Pack years: 0.75    Types: Cigarettes    Last attempt to quit: 02/17/2002    Years since quitting: 16.4  . Smokeless tobacco: Never Used  Substance Use Topics  . Alcohol use: Yes    Alcohol/week: 0.0 standard drinks    Comment: Rare - 2  year  . Drug use: No    Review of  Systems Patient denies headaches, rhinorrhea, blurry vision, numbness, shortness of breath, chest pain, edema, cough, abdominal pain, nausea, vomiting, diarrhea, dysuria, fevers, rashes or hallucinations unless otherwise stated above in HPI. ____________________________________________   PHYSICAL EXAM:  VITAL SIGNS: Vitals:   08/04/18 0637  BP: (!) 181/101  Pulse: (!) 107  Resp: 15  Temp: 97.9 F (36.6 C)  SpO2: 98%    Constitutional: Alert and oriented.  Eyes: Conjunctivae are normal.  Head: Atraumatic. Nose: No congestion/rhinnorhea. Mouth/Throat: Mucous membranes are moist.   Neck: No stridor. Painless ROM.   Trach collar intact Cardiovascular: Normal rate, regular rhythm. Grossly normal heart sounds.  Good peripheral circulation. Respiratory: Normal respiratory effort.  No retractions. Lungs CTAB. Gastrointestinal: Soft and nontender. No distention. No abdominal bruits. No CVA tenderness. Genitourinary:  Musculoskeletal: No lower extremity tenderness nor edema.  No joint effusions. Neurologic:  Normal speech and language. No gross focal neurologic deficits are appreciated. No facial droop Skin:  Skin is warm, dry and intact. No rash noted. Psychiatric: Mood and affect are normal. Speech and behavior are normal.  ____________________________________________   LABS (all labs ordered are listed, but only abnormal results are displayed)  Results for orders placed or performed during the hospital encounter of 08/04/18 (from the past 24 hour(s))  Urinalysis, Complete w Microscopic     Status: Abnormal   Collection Time: 08/04/18  6:50 AM  Result Value Ref Range   Color, Urine STRAW (A) YELLOW   APPearance CLEAR (A) CLEAR   Specific Gravity, Urine 1.008 1.005 - 1.030   pH 8.0 5.0 - 8.0   Glucose, UA NEGATIVE NEGATIVE mg/dL   Hgb urine dipstick NEGATIVE NEGATIVE   Bilirubin Urine NEGATIVE NEGATIVE   Ketones, ur NEGATIVE NEGATIVE mg/dL   Protein, ur NEGATIVE NEGATIVE  mg/dL   Nitrite NEGATIVE NEGATIVE   Leukocytes, UA NEGATIVE NEGATIVE   WBC, UA 0-5 0 - 5 WBC/hpf   Bacteria, UA NONE SEEN NONE SEEN   Squamous Epithelial / LPF 0-5 0 - 5   Mucus PRESENT    Hyaline Casts, UA PRESENT   Pregnancy, urine     Status: None   Collection Time: 08/04/18  6:50 AM  Result Value Ref Range   Preg Test, Ur NEGATIVE NEGATIVE  CBC     Status: None   Collection Time: 08/04/18  7:07 AM  Result Value Ref Range   WBC 8.3 3.6 - 11.0 K/uL   RBC 4.56 3.80 - 5.20 MIL/uL   Hemoglobin 12.2 12.0 - 16.0 g/dL   HCT 16.1 09.6 - 04.5 %   MCV 81.0 80.0 - 100.0 fL   MCH 26.8 26.0 - 34.0 pg   MCHC 33.0  32.0 - 36.0 g/dL   RDW 16.114.0 09.611.5 - 04.514.5 %   Platelets 377 150 - 440 K/uL  Troponin I     Status: None   Collection Time: 08/04/18  7:08 AM  Result Value Ref Range   Troponin I <0.03 <0.03 ng/mL  Comprehensive metabolic panel     Status: Abnormal   Collection Time: 08/04/18  7:08 AM  Result Value Ref Range   Sodium 135 135 - 145 mmol/L   Potassium 3.6 3.5 - 5.1 mmol/L   Chloride 99 98 - 111 mmol/L   CO2 28 22 - 32 mmol/L   Glucose, Bld 169 (H) 70 - 99 mg/dL   BUN 7 6 - 20 mg/dL   Creatinine, Ser 4.090.69 0.44 - 1.00 mg/dL   Calcium 8.9 8.9 - 81.110.3 mg/dL   Total Protein 8.5 (H) 6.5 - 8.1 g/dL   Albumin 4.1 3.5 - 5.0 g/dL   AST 21 15 - 41 U/L   ALT 15 0 - 44 U/L   Alkaline Phosphatase 68 38 - 126 U/L   Total Bilirubin 0.5 0.3 - 1.2 mg/dL   GFR calc non Af Amer >60 >60 mL/min   GFR calc Af Amer >60 >60 mL/min   Anion gap 8 5 - 15  Glucose, capillary     Status: Abnormal   Collection Time: 08/04/18  7:17 AM  Result Value Ref Range   Glucose-Capillary 182 (H) 70 - 99 mg/dL   ____________________________________________  EKG My review and personal interpretation at Time: 6:38   Indication: near syncope  Rate: 100  Rhythm: sinus Axis: normal Other: normal intervals, no  stemi ____________________________________________  RADIOLOGY   ____________________________________________   PROCEDURES  Procedure(s) performed:  Procedures    Critical Care performed: no ____________________________________________   INITIAL IMPRESSION / ASSESSMENT AND PLAN / ED COURSE  Pertinent labs & imaging results that were available during my care of the patient were reviewed by me and considered in my medical decision making (see chart for details).   DDX: dehydration, electrolyte abn, anemia, dysrhythmia, ectopic, uti  Carol [name] is a 37 y.o. who presents to the ED with symptoms as described above.  Patient is AFVSS in ED. Exam as above. Given current presentation have considered the above differential.  The patient will be placed on continuous pulse oximetry and telemetry for monitoring.  Laboratory evaluation will be sent to evaluate for the above complaints.  Patient appears well right now.  Clinically seems most consistent with hypoglycemic episode fixatives the patient ate something this morning.  EKG shows no preexcitation syndrome or evidence of acute ischemia.  She is not hypoxic and does not have any respiratory symptoms at this time.  Doubt cardiopulmonary etiology.  Will give small bolus of fluid observe patient on monitor and check blood work.   Clinical Course as of Aug 04 900  Wynelle LinkSun Aug 04, 2018  [id number] Patient's heart rate 90 while resting.  she is able to ambulate with steady gait and no palpitations lightheadedness or discomfort.  Suspect some component of dehydration possible hypoglycemic episode.  Unlikely cardiac in etiology.  No respiratory distress.  Certainly no sign of infection.  She is tolerating oral hydration without any persistent symptoms I do believe she stable and appropriate for outpatient follow-up.  Have discussed with the patient and available family all diagnostics and treatments performed thus far and all questions were answered to  the best of my ability. The patient demonstrates understanding and agreement with plan.    [  PR]    Clinical Course User Index [PR] Willy Eddy, MD     As part of my medical decision making, I reviewed the following data within the electronic MEDICAL RECORD NUMBER Nursing notes reviewed and incorporated, Labs reviewed, notes from prior ED visits.   ____________________________________________   FINAL CLINICAL IMPRESSION(S) / ED DIAGNOSES  Final diagnoses:  Near syncope      NEW MEDICATIONS STARTED DURING THIS VISIT:  New Prescriptions   No medications on file     Note:  This document was prepared using Dragon voice recognition software and may include unintentional dictation errors.    Willy Eddy, MD 08/04/18 [phone]

## 2018-08-04 NOTE — ED Triage Notes (Signed)
Pt arrives via [facility] with c/o near syncopal event. Pt reports that she sat down to the floor with her husband's assistance when she felt like she was going to pass out. Pt denies LOC at this time. Pt reports that this has happened previously in the past and her CBG was less than 50. Per EMS, pt had CBG of 208 but pt reports that before they got there, she had eaten some candy and drunk some ginger ale. Pt is in NAD.

## 2018-08-21 ENCOUNTER — Encounter: Payer: Self-pay | Admitting: Family

## 2018-08-30 ENCOUNTER — Ambulatory Visit: Payer: Self-pay | Admitting: Family

## 2018-09-20 ENCOUNTER — Ambulatory Visit: Payer: Self-pay | Admitting: Family

## 2018-09-25 DIAGNOSIS — L280 Lichen simplex chronicus: Secondary | ICD-10-CM | POA: Diagnosis not present

## 2018-09-26 ENCOUNTER — Other Ambulatory Visit (INDEPENDENT_AMBULATORY_CARE_PROVIDER_SITE_OTHER): Payer: BLUE CROSS/BLUE SHIELD

## 2018-09-26 DIAGNOSIS — Z794 Long term (current) use of insulin: Secondary | ICD-10-CM

## 2018-09-26 DIAGNOSIS — E119 Type 2 diabetes mellitus without complications: Secondary | ICD-10-CM | POA: Diagnosis not present

## 2018-09-26 DIAGNOSIS — I10 Essential (primary) hypertension: Secondary | ICD-10-CM

## 2018-09-26 LAB — HEMOGLOBIN A1C: Hgb A1c MFr Bld: 7.9 % — ABNORMAL HIGH (ref 4.6–6.5)

## 2018-09-26 LAB — COMPREHENSIVE METABOLIC PANEL
ALBUMIN: 4 g/dL (ref 3.5–5.2)
ALK PHOS: 75 U/L (ref 39–117)
ALT: 12 U/L (ref 0–35)
AST: 15 U/L (ref 0–37)
BUN: 14 mg/dL (ref 6–23)
CHLORIDE: 100 meq/L (ref 96–112)
CO2: 32 mEq/L (ref 19–32)
Calcium: 9.3 mg/dL (ref 8.4–10.5)
Creatinine, Ser: 0.76 mg/dL (ref 0.40–1.20)
GFR: 108.83 mL/min (ref >60.00)
Glucose, Bld: 94 mg/dL (ref 70–99)
Potassium: 3.9 mEq/L (ref 3.5–5.1)
SODIUM: 139 meq/L (ref 135–145)
TOTAL PROTEIN: 8.1 g/dL (ref 6.0–8.3)
Total Bilirubin: 0.3 mg/dL (ref 0.2–1.2)

## 2018-09-26 LAB — LIPID PANEL
CHOLESTEROL: 138 mg/dL (ref 0–200)
HDL: 58.8 mg/dL (ref >39.00)
LDL CALC: 65 mg/dL (ref 0–99)
NonHDL: 79.28
TRIGLYCERIDES: 72 mg/dL (ref 0.0–149.0)
Total CHOL/HDL Ratio: 2
VLDL: 14.4 mg/dL (ref 0.0–40.0)

## 2018-09-27 ENCOUNTER — Other Ambulatory Visit: Payer: Self-pay | Admitting: Family

## 2018-09-27 DIAGNOSIS — I10 Essential (primary) hypertension: Secondary | ICD-10-CM

## 2018-09-27 NOTE — Telephone Encounter (Signed)
NT called pharmacy.  Meds are available at pharmacy for pt to pick up.

## 2018-09-27 NOTE — Telephone Encounter (Signed)
Copied from CRM [id number]. Topic: Quick Communication - Rx Refill/Question >> Sep 27, 2018  2:08 PM Waymon Amato wrote: Medication: amlodipine and losartan  Has the patient contacted their pharmacy? no (Agent: If no, request that the patient contact the pharmacy for the refill.) (Agent: If yes, when and what did the pharmacy advise?)  Preferred Pharmacy (with phone number or street name): [facility] [address] -pt request 90 day supply due to insurance  Agent: Please be advised that RX refills may take up to 3 business days. We ask that you follow-up with your pharmacy.

## 2018-09-27 NOTE — Telephone Encounter (Signed)
Left VM with patient; requested meds are available at preferred pharmacy.

## 2018-10-02 ENCOUNTER — Other Ambulatory Visit: Payer: Self-pay | Admitting: Family

## 2018-10-02 DIAGNOSIS — F909 Attention-deficit hyperactivity disorder, unspecified type: Secondary | ICD-10-CM

## 2018-10-02 NOTE — Telephone Encounter (Signed)
mychart message sent

## 2018-10-23 ENCOUNTER — Other Ambulatory Visit: Payer: Self-pay | Admitting: Family

## 2018-10-23 MED ORDER — METFORMIN HCL ER 500 MG PO TB24: ORAL_TABLET | ORAL | 3 refills | Status: DC

## 2018-10-29 ENCOUNTER — Telehealth: Payer: Self-pay

## 2018-10-29 NOTE — Telephone Encounter (Addendum)
Prior authorization submitted for metformin ER Case number [id number]

## 2018-11-01 MED ORDER — METFORMIN HCL ER 500 MG PO TB24: ORAL_TABLET | ORAL | 1 refills | Status: DC

## 2018-11-01 NOTE — Telephone Encounter (Signed)
[facility] and when they run script for [facility] it show rejection code showing that patient is on [employer] diabetes care [facility]. Spoke with patient and [facility] [name]. [facility] [name] per patients request.

## 2018-11-01 NOTE — Addendum Note (Signed)
Addended by: Johna RolesBARE, Lainy Wrobleski C on: 11/01/2018 11:30 AM   Modules accepted: Orders

## 2018-11-04 DIAGNOSIS — Z23 Encounter for immunization: Secondary | ICD-10-CM | POA: Diagnosis not present

## 2018-11-21 ENCOUNTER — Other Ambulatory Visit: Payer: Self-pay | Admitting: Family

## 2018-11-21 DIAGNOSIS — F909 Attention-deficit hyperactivity disorder, unspecified type: Secondary | ICD-10-CM

## 2018-11-24 ENCOUNTER — Encounter: Payer: Self-pay | Admitting: Family

## 2018-11-24 MED ORDER — AMPHETAMINE-DEXTROAMPHET ER 20 MG PO CP24: 20.0000 mg | ORAL_CAPSULE | Freq: Every day | ORAL | 0 refills | Status: DC

## 2018-11-24 NOTE — Telephone Encounter (Signed)
I looked up patient on [place] Controlled Substances Reporting System and saw no activity that raised concern of inappropriate use.   

## 2018-12-04 DIAGNOSIS — L280 Lichen simplex chronicus: Secondary | ICD-10-CM | POA: Diagnosis not present

## 2019-04-23 ENCOUNTER — Ambulatory Visit (INDEPENDENT_AMBULATORY_CARE_PROVIDER_SITE_OTHER): Payer: BLUE CROSS/BLUE SHIELD | Admitting: Family

## 2019-04-23 ENCOUNTER — Encounter: Payer: Self-pay | Admitting: Family

## 2019-04-23 ENCOUNTER — Other Ambulatory Visit: Payer: Self-pay

## 2019-04-23 DIAGNOSIS — Z1239 Encounter for other screening for malignant neoplasm of breast: Secondary | ICD-10-CM | POA: Diagnosis not present

## 2019-04-23 DIAGNOSIS — F419 Anxiety disorder, unspecified: Secondary | ICD-10-CM

## 2019-04-23 DIAGNOSIS — I10 Essential (primary) hypertension: Secondary | ICD-10-CM | POA: Diagnosis not present

## 2019-04-23 DIAGNOSIS — E1100 Type 2 diabetes mellitus with hyperosmolarity without nonketotic hyperglycemic-hyperosmolar coma (NKHHC): Secondary | ICD-10-CM

## 2019-04-23 DIAGNOSIS — F909 Attention-deficit hyperactivity disorder, unspecified type: Secondary | ICD-10-CM

## 2019-04-23 DIAGNOSIS — E119 Type 2 diabetes mellitus without complications: Secondary | ICD-10-CM

## 2019-04-23 DIAGNOSIS — Z794 Long term (current) use of insulin: Secondary | ICD-10-CM

## 2019-04-23 DIAGNOSIS — F329 Major depressive disorder, single episode, unspecified: Secondary | ICD-10-CM

## 2019-04-23 DIAGNOSIS — F32A Depression, unspecified: Secondary | ICD-10-CM

## 2019-04-23 DIAGNOSIS — E118 Type 2 diabetes mellitus with unspecified complications: Secondary | ICD-10-CM

## 2019-04-23 MED ORDER — AMPHETAMINE-DEXTROAMPHET ER 20 MG PO CP24: 20.0000 mg | ORAL_CAPSULE | Freq: Every day | ORAL | 0 refills | Status: DC

## 2019-04-23 MED ORDER — BASAGLAR KWIKPEN 100 UNIT/ML ~~LOC~~ SOPN: 50.0000 [IU] | PEN_INJECTOR | Freq: Every day | SUBCUTANEOUS | 1 refills | Status: DC

## 2019-04-23 MED ORDER — LIRAGLUTIDE 18 MG/3ML ~~LOC~~ SOPN: 0.6000 mg | PEN_INJECTOR | Freq: Every day | SUBCUTANEOUS | 2 refills | Status: DC

## 2019-04-23 NOTE — Assessment & Plan Note (Signed)
Improved; trial of 100mg  zoloft. Close follow up.

## 2019-04-23 NOTE — Assessment & Plan Note (Addendum)
  BP Readings from Last 3 Encounters:  08/04/18 (!) 160/76  08/02/18 (!) 150/102  05/07/18 124/82   Unsure if well controlled. Patient to send log.  Discussed at great length newest guidelines and goal being less than 120/ 80.  Discussed lifestyle modification including weight loss.  Close follow-up

## 2019-04-23 NOTE — Assessment & Plan Note (Signed)
Suspect improved. Restart victoza. Pending a1c. Continue regimen

## 2019-04-23 NOTE — Patient Instructions (Addendum)
Trial of zoloft 100mg  at bedtime. Let me know how you do on this dose.   Monitor blood pressure,  Goal is less than 120/80, based on newest guidelines; if persistently higher, please make sooner follow up appointment so we can recheck you blood pressure and manage medications  Please send me a blood pressure log  We placed a referral for mammogram this year. I asked that you call one the below and schedule this when it is convenient for you.   As discussed, I would like you to ask for 3D mammogram over the traditional 2D mammogram as new evidence suggest 3D is superior.     [facility]  [address]  [place], [place]  [phone]  * Offers 3D mammogram if you ask*    See you for your physical!

## 2019-04-23 NOTE — Progress Notes (Signed)
This visit type was conducted due to national recommendations for restrictions regarding the COVID-19 pandemic (e.g. social distancing).  This format is felt to be most appropriate for this patient at this time.  All issues noted in this document were discussed and addressed.  No physical exam was performed (except for noted visual exam findings with Video Visits). Virtual Visit via Video Note  I connected with@  on 04/23/19 at  8:00 AM EDT by a video enabled telemedicine application and verified that I am speaking with the correct person using two identifiers.  Location patient: home Location provider:work  Persons participating in the virtual visit: patient, provider  I discussed the limitations of evaluation and management by telemedicine and the availability of in person appointments. The patient expressed understanding and agreed to proceed.   HPI:  Feels well today. No complaints.   HTN- compliant with medication. At home 130 , doesn't recall DBP. Denies exertional chest pain or pressure, numbness or tingling radiating to left arm or jaw, palpitations, dizziness, frequent headaches, changes in vision, or shortness of breath.    DM- Feels improved.  Glargine 50 units. Had forgotten to take victoza.  FGB 101-135.   Started exercise program, walking 2 miles at park 4 days/week. No hypoglycemia. No blurry vision.  GAD- improved on zoloft. Not on 'edge as much'. Sleeping well.  No si/hi.  ADHD- feels well on adderall and dose. Needs refill.       ROS: See pertinent positives and negatives per HPI.  Past Medical History:  Diagnosis Date  . Attention deficit disorder (ADD)   . Diabetes mellitus without complication (HCC) 2009  . Hypertension   . Subglottic stenosis    Trach collar    Past Surgical History:  Procedure Laterality Date  . BREAST BIOPSY Right   . TRACHEOSTOMY  2010    Family History  Problem Relation Age of Onset  . Diabetes Father   . Hypertension  Father   . Hypertension Brother   . Breast cancer Neg Hx   . Thyroid cancer Neg Hx     SOCIAL HX: former smoker   Current Outpatient Medications:  .  amLODipine (NORVASC) 10 MG tablet, Take 1 tablet (10 mg total) by mouth daily., Disp: 90 tablet, Rfl: 2 .  amphetamine-dextroamphetamine (ADDERALL XR) 20 MG 24 hr capsule, Take 1 capsule (20 mg total) by mouth daily., Disp: 30 capsule, Rfl: 0 .  empagliflozin (JARDIANCE) 25 MG TABS tablet, Take 25 mg by mouth daily., Disp: 90 tablet, Rfl: 1 .  glucose blood test strip, Use as instructed up to 2 times per day, Disp: 100 each, Rfl: 12 .  losartan (COZAAR) 50 MG tablet, Take 1 tablet (50 mg total) by mouth daily., Disp: 90 tablet, Rfl: 2 .  metFORMIN (GLUCOPHAGE XR) 500 MG 24 hr tablet, Start 500mg  PO qpm., Disp: 90 tablet, Rfl: 1 .  rosuvastatin (CRESTOR) 20 MG tablet, Take 1 tablet (20 mg total) by mouth daily., Disp: 90 tablet, Rfl: 2 .  sertraline (ZOLOFT) 50 MG tablet, Take 1 tablet (50 mg total) by mouth at bedtime., Disp: 90 tablet, Rfl: 3 .  Insulin Glargine (BASAGLAR KWIKPEN) 100 UNIT/ML SOPN, Inject 0.5 mLs (50 Units total) into the skin daily at 10 pm., Disp: 15 pen, Rfl: 1 .  liraglutide (VICTOZA) 18 MG/3ML SOPN, Inject 0.1 mLs (0.6 mg total) into the skin daily. Start 0.6mg  [place] QD x 1 week, then 1.2mg  [place] qd., Disp: 3 pen, Rfl: 2  EXAM:  VITALS per  patient if applicable:  GENERAL: alert, oriented, appears well and in no acute distress  HEENT: atraumatic, conjunttiva clear, no obvious abnormalities on inspection of external nose and ears  NECK: normal movements of the head and neck  LUNGS: on inspection no signs of respiratory distress, breathing rate appears normal, no obvious gross SOB, gasping or wheezing  CV: no obvious cyanosis  MS: moves all visible extremities without noticeable abnormality  PSYCH/NEURO: pleasant and cooperative, no obvious depression or anxiety, speech and thought processing grossly  intact  ASSESSMENT AND PLAN:  Discussed the following assessment and plan:  Attention deficit hyperactivity disorder (ADHD), unspecified ADHD type - Plan: amphetamine-dextroamphetamine (ADDERALL XR) 20 MG 24 hr capsule, DISCONTINUED: amphetamine-dextroamphetamine (ADDERALL XR) 20 MG 24 hr capsule, DISCONTINUED: amphetamine-dextroamphetamine (ADDERALL XR) 20 MG 24 hr capsule  Benign essential HTN - Plan: CBC with Differential/Platelet, Comprehensive metabolic panel, Hemoglobin A1c, Lipid panel, TSH, VITAMIN D 25 Hydroxy (Vit-D Deficiency, Fractures)  Type 2 diabetes mellitus with complication, with long-term current use of insulin (HCC) - Plan: liraglutide (VICTOZA) 18 MG/3ML SOPN  Screening for breast cancer - Plan: MM 3D SCREEN BREAST BILATERAL  Type 2 diabetes mellitus without complication, with long-term current use of insulin (HCC)  Anxiety and depression  Problem List Items Addressed This Visit      Cardiovascular and Mediastinum   Benign essential HTN     BP Readings from Last 3 Encounters:  08/04/18 (!) 160/76  08/02/18 (!) 150/102  05/07/18 124/82   Unsure if well controlled. Patient to send log.  Discussed at great length newest guidelines and goal being less than 120/ 80.  Discussed lifestyle modification including weight loss.  Close follow-up      Relevant Orders   CBC with Differential/Platelet   Comprehensive metabolic panel   Hemoglobin A1c   Lipid panel   TSH   VITAMIN D 25 Hydroxy (Vit-D Deficiency, Fractures)     Endocrine   DM type 2 (diabetes mellitus, type 2) (HCC)    Suspect improved. Restart victoza. Pending a1c. Continue regimen      Relevant Medications   liraglutide (VICTOZA) 18 MG/3ML SOPN     Other   ADHD (attention deficit hyperactivity disorder) - Primary    Stable. Continue I looked up patient on [place] Controlled Substances Reporting System and saw no activity that raised concern of inappropriate use.        Relevant Medications    amphetamine-dextroamphetamine (ADDERALL XR) 20 MG 24 hr capsule   Anxiety and depression    Improved; trial of 100mg  zoloft. Close follow up.        Other Visit Diagnoses    Type 2 diabetes mellitus with complication, with long-term current use of insulin (HCC)       Relevant Medications   liraglutide (VICTOZA) 18 MG/3ML SOPN   Screening for breast cancer       Relevant Orders   MM 3D SCREEN BREAST BILATERAL        I discussed the assessment and treatment plan with the patient. The patient was provided an opportunity to ask questions and all were answered. The patient agreed with the plan and demonstrated an understanding of the instructions.   The patient was advised to call back or seek an in-person evaluation if the symptoms worsen or if the condition fails to improve as anticipated.   Rennie Plowman, FNP

## 2019-04-23 NOTE — Assessment & Plan Note (Signed)
Stable. Continue I looked up patient on [place] Controlled Substances Reporting System and saw no activity that raised concern of inappropriate use.

## 2019-05-06 ENCOUNTER — Encounter: Payer: Self-pay | Admitting: Family

## 2019-05-19 ENCOUNTER — Other Ambulatory Visit: Payer: Self-pay

## 2019-05-19 ENCOUNTER — Other Ambulatory Visit: Payer: Self-pay | Admitting: Family

## 2019-05-19 DIAGNOSIS — E1100 Type 2 diabetes mellitus with hyperosmolarity without nonketotic hyperglycemic-hyperosmolar coma (NKHHC): Secondary | ICD-10-CM

## 2019-05-19 DIAGNOSIS — E118 Type 2 diabetes mellitus with unspecified complications: Secondary | ICD-10-CM

## 2019-05-19 MED ORDER — LIRAGLUTIDE 18 MG/3ML ~~LOC~~ SOPN: 0.6000 mg | PEN_INJECTOR | Freq: Every day | SUBCUTANEOUS | 2 refills | Status: DC

## 2019-05-28 ENCOUNTER — Encounter: Payer: BLUE CROSS/BLUE SHIELD | Admitting: Family

## 2019-05-28 ENCOUNTER — Other Ambulatory Visit: Payer: Self-pay

## 2019-06-13 ENCOUNTER — Other Ambulatory Visit: Payer: Self-pay | Admitting: Family

## 2019-06-13 DIAGNOSIS — I10 Essential (primary) hypertension: Secondary | ICD-10-CM

## 2019-06-19 ENCOUNTER — Other Ambulatory Visit: Payer: Self-pay

## 2019-06-30 ENCOUNTER — Ambulatory Visit (INDEPENDENT_AMBULATORY_CARE_PROVIDER_SITE_OTHER): Payer: BC Managed Care – PPO | Admitting: Family

## 2019-06-30 ENCOUNTER — Encounter: Payer: Self-pay | Admitting: Family

## 2019-06-30 ENCOUNTER — Other Ambulatory Visit: Payer: Self-pay

## 2019-06-30 VITALS — BP 164/98 | HR 89 | Temp 97.8°F | Ht 64.0 in | Wt 189.0 lb

## 2019-06-30 DIAGNOSIS — N6310 Unspecified lump in the right breast, unspecified quadrant: Secondary | ICD-10-CM

## 2019-06-30 DIAGNOSIS — Z0000 Encounter for general adult medical examination without abnormal findings: Secondary | ICD-10-CM | POA: Diagnosis not present

## 2019-06-30 DIAGNOSIS — F909 Attention-deficit hyperactivity disorder, unspecified type: Secondary | ICD-10-CM

## 2019-06-30 DIAGNOSIS — E119 Type 2 diabetes mellitus without complications: Secondary | ICD-10-CM | POA: Diagnosis not present

## 2019-06-30 DIAGNOSIS — Z23 Encounter for immunization: Secondary | ICD-10-CM | POA: Diagnosis not present

## 2019-06-30 DIAGNOSIS — Z794 Long term (current) use of insulin: Secondary | ICD-10-CM

## 2019-06-30 DIAGNOSIS — F419 Anxiety disorder, unspecified: Secondary | ICD-10-CM | POA: Diagnosis not present

## 2019-06-30 DIAGNOSIS — F329 Major depressive disorder, single episode, unspecified: Secondary | ICD-10-CM

## 2019-06-30 DIAGNOSIS — I10 Essential (primary) hypertension: Secondary | ICD-10-CM

## 2019-06-30 LAB — COMPREHENSIVE METABOLIC PANEL
ALT: 10 U/L (ref 0–35)
AST: 12 U/L (ref 0–37)
Albumin: 4.2 g/dL (ref 3.5–5.2)
Alkaline Phosphatase: 71 U/L (ref 39–117)
BUN: 12 mg/dL (ref 6–23)
CO2: 27 mEq/L (ref 19–32)
Calcium: 8.9 mg/dL (ref 8.4–10.5)
Chloride: 101 mEq/L (ref 96–112)
Creatinine, Ser: 0.67 mg/dL (ref 0.40–1.20)
GFR: 117.96 mL/min (ref >60.00)
Glucose, Bld: 87 mg/dL (ref 70–99)
Potassium: 3.8 mEq/L (ref 3.5–5.1)
Sodium: 139 mEq/L (ref 135–145)
Total Bilirubin: 0.3 mg/dL (ref 0.2–1.2)
Total Protein: 7.8 g/dL (ref 6.0–8.3)

## 2019-06-30 LAB — CBC WITH DIFFERENTIAL/PLATELET
Basophils Absolute: 0.1 10*3/uL (ref 0.0–0.1)
Basophils Relative: 1.1 % (ref 0.0–3.0)
Eosinophils Absolute: 0.1 10*3/uL (ref 0.0–0.7)
Eosinophils Relative: 1.7 % (ref 0.0–5.0)
HCT: 35.7 % — ABNORMAL LOW (ref 36.0–46.0)
Hemoglobin: 11.3 g/dL — ABNORMAL LOW (ref 12.0–15.0)
Lymphocytes Relative: 25.9 % (ref 12.0–46.0)
Lymphs Abs: 1.4 10*3/uL (ref 0.7–4.0)
MCHC: 31.6 g/dL (ref 30.0–36.0)
MCV: 81.6 fl (ref 78.0–100.0)
Monocytes Absolute: 0.4 10*3/uL (ref 0.1–1.0)
Monocytes Relative: 7.7 % (ref 3.0–12.0)
Neutro Abs: 3.4 10*3/uL (ref 1.4–7.7)
Neutrophils Relative %: 63.6 % (ref 43.0–77.0)
Platelets: 335 10*3/uL (ref 150.0–400.0)
RBC: 4.37 Mil/uL (ref 3.87–5.11)
RDW: 14 % (ref 11.5–15.5)
WBC: 5.3 10*3/uL (ref 4.0–10.5)

## 2019-06-30 LAB — LIPID PANEL
Cholesterol: 156 mg/dL (ref 0–200)
HDL: 54 mg/dL (ref >39.00)
LDL Cholesterol: 92 mg/dL (ref 0–99)
NonHDL: 102.15
Total CHOL/HDL Ratio: 3
Triglycerides: 53 mg/dL (ref 0.0–149.0)
VLDL: 10.6 mg/dL (ref 0.0–40.0)

## 2019-06-30 LAB — VITAMIN D 25 HYDROXY (VIT D DEFICIENCY, FRACTURES): VITD: 21.42 ng/mL — ABNORMAL LOW (ref 30.00–100.00)

## 2019-06-30 LAB — TSH: TSH: 2.15 u[IU]/mL (ref 0.35–4.50)

## 2019-06-30 LAB — HEMOGLOBIN A1C: Hgb A1c MFr Bld: 7.6 % — ABNORMAL HIGH (ref 4.6–6.5)

## 2019-06-30 MED ORDER — SERTRALINE HCL 100 MG PO TABS: 100.0000 mg | ORAL_TABLET | Freq: Every day | ORAL | 1 refills | Status: DC

## 2019-06-30 NOTE — Assessment & Plan Note (Signed)
Known right breast mass. Pending screening mammogram

## 2019-06-30 NOTE — Assessment & Plan Note (Signed)
Suspect improved. Pending a1c 

## 2019-06-30 NOTE — Assessment & Plan Note (Signed)
Elevated however hasnt taken medication today; will take when she gets home. Home reading at goal.

## 2019-06-30 NOTE — Patient Instructions (Addendum)
Such a pleasure seeing you, always  Please make annual exam with your dermatologist  Please call call and schedule your 3D mammogram as discussed. Please inform them about your breast biopsy (  Left) and right breast mass so they are aware.    [place]  [place], [place]     Health Maintenance, Female Adopting a healthy lifestyle and getting preventive care are important in promoting health and wellness. Ask your health care provider about:  The right schedule for you to have regular tests and exams.  Things you can do on your own to prevent diseases and keep yourself healthy. What should I know about diet, weight, and exercise? Eat a healthy diet   Eat a diet that includes plenty of vegetables, fruits, low-fat dairy products, and lean protein.  Do not eat a lot of foods that are high in solid fats, added sugars, or sodium. Maintain a healthy weight Body mass index (BMI) is used to identify weight problems. It estimates body fat based on height and weight. Your health care provider can help determine your BMI and help you achieve or maintain a healthy weight. Get regular exercise Get regular exercise. This is one of the most important things you can do for your health. Most adults should:  Exercise for at least 150 minutes each week. The exercise should increase your heart rate and make you sweat (moderate-intensity exercise).  Do strengthening exercises at least twice a week. This is in addition to the moderate-intensity exercise.  Spend less time sitting. Even light physical activity can be beneficial. Watch cholesterol and blood lipids Have your blood tested for lipids and cholesterol at 40 years of age, then have this test every 5 years. Have your cholesterol levels checked more often if:  Your lipid or cholesterol levels are high.  You are older than 40 years of age.  You are at high risk for heart disease.  What should I know about cancer screening? Depending on your health history and family history, you may need to have cancer screening at various ages. This may include screening for:  Breast cancer.  Cervical cancer.  Colorectal cancer.  Skin cancer.  Lung cancer. What should I know about heart disease, diabetes, and high blood pressure? Blood pressure and heart disease  High blood pressure causes heart disease and increases the risk of stroke. This is more likely to develop in people who have high blood pressure readings, are of African descent, or are overweight.  Have your blood pressure checked: ? Every 3-5 years if you are 53-46 years of age. ? Every year if you are 42 years old or older. Diabetes Have regular diabetes screenings. This checks your fasting blood sugar level. Have the screening done:  Once every three years after age 26 if you are at a normal weight and have a low risk for diabetes.  More often and at a younger age if you are overweight or have a high risk for diabetes. What should I know about preventing infection? Hepatitis B If you have a higher risk for hepatitis B, you should be screened for this virus. Talk with your health care provider to find out if you are at risk for hepatitis B infection. Hepatitis C Testing is recommended for:  Everyone born from [birth year] through 1967.  Anyone with known risk factors for hepatitis C. Sexually transmitted infections (STIs)  Get screened for STIs, including gonorrhea and chlamydia, if: ? You  are sexually active and are younger than 40 years of age. ? You are older than 40 years of age and your health care provider tells you that you are at risk for this type of infection. ? Your sexual activity has changed since you were last screened, and you are at increased risk for chlamydia or gonorrhea. Ask your health care provider if you are at risk.  Ask your health care provider about whether you are at high risk for  HIV. Your health care provider may recommend a prescription medicine to help prevent HIV infection. If you choose to take medicine to prevent HIV, you should first get tested for HIV. You should then be tested every 3 months for as long as you are taking the medicine. Pregnancy  If you are about to stop having your period (premenopausal) and you may become pregnant, seek counseling before you get pregnant.  Take 400 to 800 micrograms (mcg) of folic acid every day if you become pregnant.  Ask for birth control (contraception) if you want to prevent pregnancy. Osteoporosis and menopause Osteoporosis is a disease in which the bones lose minerals and strength with aging. This can result in bone fractures. If you are 40 years old or older, or if you are at risk for osteoporosis and fractures, ask your health care provider if you should:  Be screened for bone loss.  Take a calcium or vitamin D supplement to lower your risk of fractures.  Be given hormone replacement therapy (HRT) to treat symptoms of menopause. Follow these instructions at home: Lifestyle  Do not use any products that contain nicotine or tobacco, such as cigarettes, e-cigarettes, and chewing tobacco. If you need help quitting, ask your health care provider.  Do not use street drugs.  Do not share needles.  Ask your health care provider for help if you need support or information about quitting drugs. Alcohol use  Do not drink alcohol if: ? Your health care provider tells you not to drink. ? You are pregnant, may be pregnant, or are planning to become pregnant.  If you drink alcohol: ? Limit how much you use to 0-1 drink a day. ? Limit intake if you are breastfeeding.  Be aware of how much alcohol is in your drink. In the [place] one drink equals one 12 oz bottle of beer (355 mL), one 5 oz glass of wine (148 mL), or one 1 oz glass of hard liquor (44 mL). General instructions  Schedule regular health, dental, and eye  exams.  Stay current with your vaccines.  Tell your health care provider if: ? You often feel depressed. ? You have ever been abused or do not feel safe at home. Summary  Adopting a healthy lifestyle and getting preventive care are important in promoting health and wellness.  Follow your health care provider's instructions about healthy diet, exercising, and getting tested or screened for diseases.  Follow your health care provider's instructions on monitoring your cholesterol and blood pressure. This information is not intended to replace advice given to you by your health care provider. Make sure you discuss any questions you have with your health care provider. Document Released: 06/19/2011 Document Revised: 11/27/2018 Document Reviewed: 11/27/2018 Elsevier Patient Education  2020 [employer]

## 2019-06-30 NOTE — Progress Notes (Signed)
Subjective:    Patient ID: Carol [name], female    DOB: 23-Feb-1981, 38 y.o.   MRN: [id number]  CC: Carol [name] is a 38 y.o. female who presents today for physical exam.    HPI: Feels well today. No complaints.   No complaints. HTN- didn't take medications today. At home, 113/80. Denies exertional chest pain or pressure, numbness or tingling radiating to left arm or jaw, palpitations, dizziness, frequent headaches, changes in vision, or shortness of breath.    GAD- improved. zoloft 100mg . 'loves the medication' . Sleeping well.   DM- compliant with medication. FBG 120. No hypoglycemic episodes.   ADHD- doing well on adderall.      04/2010 US breast right- consistent with fibroadenoma; suggest BL yearly at age 69yo.   Colorectal Cancer Screening: no early family history Breast Cancer Screening: Mammogram due; turning 38 yo.  Left breast biopsy 2018- benign per pateint.  Known right breast mass at 6 oclock which was evaluated in the past and benign per patient.  Cervical Cancer Screening: due; on menses today and would like to return for this Bone Health screening/DEXA for 65+: No increased fracture risk. Defer screening at this time. Lung Cancer Screening: Doesn't have 30 year pack year history and age > 56 years. Immunizations       Tetanus - due        Pneumococcal - Candidate for.   Labs: Screening labs today. Exercise: Gets regular exercise.  Alcohol use: rare Smoking/tobacco use: former smoker.  Regular dental exams: UTD Wears seat belt: Yes. Skin: established with dermatology  HISTORY:  Past Medical History:  Diagnosis Date  . Attention deficit disorder (ADD)   . Diabetes mellitus without complication ([place]) 5329  . Hypertension   . Subglottic stenosis    Trach collar    Past Surgical History:  Procedure Laterality Date  . BREAST BIOPSY Right   . TRACHEOSTOMY  2010   Family History  Problem Relation Age of Onset  . Diabetes Father   .  Hypertension Father   . Hypertension Brother   . Breast cancer Neg Hx   . Thyroid cancer Neg Hx       ALLERGIES: [place]  Current Outpatient Medications on File Prior to Visit  Medication Sig Dispense Refill  . amLODipine (NORVASC) 10 MG tablet TAKE 1 TABLET BY MOUTH DAILY 90 tablet 2  . amphetamine-dextroamphetamine (ADDERALL XR) 20 MG 24 hr capsule Take 1 capsule (20 mg total) by mouth daily. 30 capsule 0  . empagliflozin (JARDIANCE) 25 MG TABS tablet Take 25 mg by mouth daily. 90 tablet 1  . glucose blood test strip Use as instructed up to 2 times per day 100 each 12  . Insulin Glargine (BASAGLAR KWIKPEN) 100 UNIT/ML SOPN INJECT 0.5 MLS (50 UNITS   TOTAL) INTO THE SKIN DAILY AT 10PM 45 mL 1  . liraglutide (VICTOZA) 18 MG/3ML SOPN Inject 0.1 mLs (0.6 mg total) into the skin daily. Start 0.6mg  [place] QD x 1 week, then 1.2mg  [place] qd. 3 pen 2  . losartan (COZAAR) 50 MG tablet TAKE 1 TABLET BY MOUTH DAILY 90 tablet 2  . metFORMIN (GLUCOPHAGE XR) 500 MG 24 hr tablet Start 500mg  PO qpm. 90 tablet 1  . rosuvastatin (CRESTOR) 20 MG tablet Take 1 tablet (20 mg total) by mouth daily. 90 tablet 2   No current facility-administered medications on file prior to visit.     Social History   Tobacco Use  . Smoking status:  Former Smoker    Packs/day: 0.25    Years: 3.00    Pack years: 0.75    Types: Cigarettes    Quit date: 02/17/2002    Years since quitting: 17.3  . Smokeless tobacco: Never Used  Substance Use Topics  . Alcohol use: Yes    Alcohol/week: 0.0 standard drinks    Comment: Rare - 2  year  . Drug use: No    Review of Systems  Constitutional: Negative for chills, fever and unexpected weight change.  HENT: Negative for congestion.   Respiratory: Negative for cough.   Cardiovascular: Negative for chest pain, palpitations and leg swelling.  Gastrointestinal: Negative for nausea and vomiting.  Musculoskeletal: Negative for arthralgias and myalgias.  Skin: Negative for rash.   Neurological: Negative for headaches.  Hematological: Negative for adenopathy.  Psychiatric/Behavioral: Negative for confusion.      Objective:    BP (!) 164/98   Pulse 89   Temp 97.8 F (36.6 C) (Oral)   Ht 5\' 4"  (1.626 m)   Wt 189 lb (85.7 kg)   LMP 06/30/2019   SpO2 97%   BMI 32.44 kg/m   BP Readings from Last 3 Encounters:  06/30/19 (!) 164/98  08/04/18 (!) 160/76  08/02/18 (!) 150/102   Wt Readings from Last 3 Encounters:  06/30/19 189 lb (85.7 kg)  08/04/18 174 lb (78.9 kg)  08/02/18 174 lb (78.9 kg)    Physical Exam Vitals signs reviewed.  Constitutional:      Appearance: She is well-developed.  Eyes:     Conjunctiva/sclera: Conjunctivae normal.  Neck:     Thyroid: No thyroid mass or thyromegaly.  Cardiovascular:     Rate and Rhythm: Normal rate and regular rhythm.     Pulses: Normal pulses.     Heart sounds: Normal heart sounds.  Pulmonary:     Effort: Pulmonary effort is normal.     Breath sounds: Normal breath sounds. No wheezing, rhonchi or rales.  Chest:     Breasts: Breasts are symmetrical.        Right: Mass present. No inverted nipple, nipple discharge, skin change or tenderness.        Left: No inverted nipple, mass, nipple discharge, skin change or tenderness.       Comments: Non tender palpable 2-3cm mass appreciated 6 oclock.  Lymphadenopathy:     Head:     Right side of head: No submental, submandibular, tonsillar, preauricular, posterior auricular or occipital adenopathy.     Left side of head: No submental, submandibular, tonsillar, preauricular, posterior auricular or occipital adenopathy.     Cervical: No cervical adenopathy.     Right cervical: No superficial, deep or posterior cervical adenopathy.    Left cervical: No superficial, deep or posterior cervical adenopathy.  Skin:    General: Skin is warm and dry.  Neurological:     Mental Status: She is alert.  Psychiatric:        Speech: Speech normal.        Behavior: Behavior  normal.        Thought Content: Thought content normal.        Assessment & Plan:   Problem List Items Addressed This Visit      Cardiovascular and Mediastinum   Benign essential HTN    Elevated however hasnt taken medication today; will take when she gets home. Home reading at goal.         Endocrine   DM type 2 (diabetes mellitus, type 2) (  HCC)    Suspect improved. Pending a1c        Other   ADHD (attention deficit hyperactivity disorder)    Doing well on adderall. Will continue      Anxiety and depression    Improved.continue zoloft      Relevant Medications   sertraline (ZOLOFT) 100 MG tablet   Breast mass, right    Known right breast mass. Pending screening mammogram      Routine physical examination - Primary    CBE performed. She will return for pap smear. Patient will schedule mammogram and follow up with dermatologist      Relevant Orders   MM 3D SCREEN BREAST BILATERAL   TSH   CBC with Differential/Platelet   Comprehensive metabolic panel   Hemoglobin A1c   Lipid panel   VITAMIN D 25 Hydroxy (Vit-D Deficiency, Fractures)       I have changed Joelene MillinKimberly M. [name]'s sertraline. I am also having her maintain her glucose blood, rosuvastatin, empagliflozin, metFORMIN, amphetamine-dextroamphetamine, Basaglar KwikPen, liraglutide, losartan, and amLODipine.   Meds ordered this encounter  Medications  . sertraline (ZOLOFT) 100 MG tablet    Sig: Take 1 tablet (100 mg total) by mouth at bedtime.    Dispense:  90 tablet    Refill:  1    Order Specific Question:   Supervising Provider    Answer:   Sherlene ShamsULLO, TERESA L [[id number]]    Return precautions given.   Risks, benefits, and alternatives of the medications and treatment plan prescribed today were discussed, and patient expressed understanding.   Education regarding symptom management and diagnosis given to patient on AVS.   Continue to follow with Allegra GranaArnett, Kymani Laursen G, FNP for routine health maintenance.    Carol [name] and I agreed with plan.   Rennie PlowmanMargaret Damara Klunder, FNP

## 2019-06-30 NOTE — Assessment & Plan Note (Signed)
Doing well on adderall. Will continue

## 2019-06-30 NOTE — Assessment & Plan Note (Signed)
Improved.continue zoloft

## 2019-06-30 NOTE — Assessment & Plan Note (Signed)
CBE performed. She will return for pap smear. Patient will schedule mammogram and follow up with dermatologist

## 2019-07-01 ENCOUNTER — Encounter: Payer: Self-pay | Admitting: Family

## 2019-07-04 ENCOUNTER — Other Ambulatory Visit: Payer: Self-pay | Admitting: Family

## 2019-07-04 DIAGNOSIS — E119 Type 2 diabetes mellitus without complications: Secondary | ICD-10-CM

## 2019-07-04 NOTE — Progress Notes (Signed)
Referral placed.

## 2019-07-14 ENCOUNTER — Other Ambulatory Visit (HOSPITAL_COMMUNITY)
Admission: RE | Admit: 2019-07-14 | Discharge: 2019-07-14 | Disposition: A | Discharge status: Home / Self Care | Payer: BC Managed Care – PPO | Source: Ambulatory Visit | Attending: Family | Admitting: Family

## 2019-07-14 ENCOUNTER — Ambulatory Visit (INDEPENDENT_AMBULATORY_CARE_PROVIDER_SITE_OTHER): Payer: BC Managed Care – PPO | Admitting: Family

## 2019-07-14 ENCOUNTER — Encounter: Payer: Self-pay | Admitting: Family

## 2019-07-14 ENCOUNTER — Other Ambulatory Visit: Payer: Self-pay

## 2019-07-14 VITALS — BP 132/82 | HR 107 | Temp 98.8°F | Ht 64.0 in | Wt 183.1 lb

## 2019-07-14 DIAGNOSIS — Z124 Encounter for screening for malignant neoplasm of cervix: Secondary | ICD-10-CM

## 2019-07-14 DIAGNOSIS — I10 Essential (primary) hypertension: Secondary | ICD-10-CM

## 2019-07-14 MED ORDER — LOSARTAN POTASSIUM 100 MG PO TABS: 100.0000 mg | ORAL_TABLET | Freq: Every day | ORAL | 3 refills | Status: DC

## 2019-07-14 NOTE — Assessment & Plan Note (Addendum)
BP Readings from Last 3 Encounters:  07/14/19 132/82  06/30/19 (!) 164/98  08/04/18 (!) 160/76   Not quite at goal, increase losartan. Patient to monitor at home.

## 2019-07-14 NOTE — Progress Notes (Signed)
Subjective:    Patient ID: Carol [name], female    DOB: 06/16/1981, 38 y.o.   MRN: [id number]  CC: Carol [name] is a 38 y.o. female who presents today for pap smear and follow up.   HPI: Feels well no complaints.   Blood pressure at home 130/80.Denies exertional chest pain or pressure, numbness or tingling radiating to left arm or jaw, palpitations, dizziness, frequent headaches, changes in vision, or shortness of breath.   Interested in Implanon     DM- eye exam last year; she will make an appt for this year  Mammogram scheduled.   Cervical Cancer Screening: due   HISTORY:  Past Medical History:  Diagnosis Date  . Attention deficit disorder (ADD)   . Diabetes mellitus without complication ([place]) 4098  . Hypertension   . Subglottic stenosis    Trach collar    Past Surgical History:  Procedure Laterality Date  . BREAST BIOPSY Right   . TRACHEOSTOMY  2010   Family History  Problem Relation Age of Onset  . Diabetes Father   . Hypertension Father   . Hypertension Brother   . Breast cancer Neg Hx   . Thyroid cancer Neg Hx       ALLERGIES: [place]  Current Outpatient Medications on File Prior to Visit  Medication Sig Dispense Refill  . amLODipine (NORVASC) 10 MG tablet TAKE 1 TABLET BY MOUTH DAILY 90 tablet 2  . amphetamine-dextroamphetamine (ADDERALL XR) 20 MG 24 hr capsule Take 1 capsule (20 mg total) by mouth daily. 30 capsule 0  . empagliflozin (JARDIANCE) 25 MG TABS tablet Take 25 mg by mouth daily. 90 tablet 1  . glucose blood test strip Use as instructed up to 2 times per day 100 each 12  . Insulin Glargine (BASAGLAR KWIKPEN) 100 UNIT/ML SOPN INJECT 0.5 MLS (50 UNITS   TOTAL) INTO THE SKIN DAILY AT 10PM 45 mL 1  . liraglutide (VICTOZA) 18 MG/3ML SOPN Inject 0.1 mLs (0.6 mg total) into the skin daily. Start 0.6mg  [place] QD x 1 week, then 1.2mg  [place] qd. 3 pen 2  . metFORMIN (GLUCOPHAGE XR) 500 MG 24 hr tablet Start 500mg  PO qpm. 90 tablet 1  .  rosuvastatin (CRESTOR) 20 MG tablet Take 1 tablet (20 mg total) by mouth daily. 90 tablet 2  . sertraline (ZOLOFT) 100 MG tablet Take 1 tablet (100 mg total) by mouth at bedtime. 90 tablet 1   No current facility-administered medications on file prior to visit.     Social History   Tobacco Use  . Smoking status: Former Smoker    Packs/day: 0.25    Years: 3.00    Pack years: 0.75    Types: Cigarettes    Quit date: 02/17/2002    Years since quitting: 17.4  . Smokeless tobacco: Never Used  Substance Use Topics  . Alcohol use: Yes    Alcohol/week: 0.0 standard drinks    Comment: Rare - 2  year  . Drug use: No    Review of Systems  Constitutional: Negative for chills, fever and unexpected weight change.  HENT: Negative for congestion.   Respiratory: Negative for cough.   Cardiovascular: Negative for chest pain, palpitations and leg swelling.  Gastrointestinal: Negative for nausea and vomiting.  Musculoskeletal: Negative for arthralgias and myalgias.  Skin: Negative for rash.  Neurological: Negative for headaches.  Hematological: Negative for adenopathy.  Psychiatric/Behavioral: Negative for confusion.      Objective:    BP 132/82  Pulse (!) 107   Temp 98.8 F (37.1 C)   Ht 5\' 4"  (1.626 m)   Wt 183 lb 1.9 oz (83.1 kg)   LMP 06/30/2019   SpO2 97%   BMI 31.43 kg/m   BP Readings from Last 3 Encounters:  07/14/19 132/82  06/30/19 (!) 164/98  08/04/18 (!) 160/76   Wt Readings from Last 3 Encounters:  07/14/19 183 lb 1.9 oz (83.1 kg)  06/30/19 189 lb (85.7 kg)  08/04/18 174 lb (78.9 kg)    Physical Exam Vitals signs reviewed.  Constitutional:      Appearance: She is well-developed.  Eyes:     Conjunctiva/sclera: Conjunctivae normal.  Cardiovascular:     Rate and Rhythm: Normal rate and regular rhythm.     Pulses: Normal pulses.     Heart sounds: Normal heart sounds.  Pulmonary:     Effort: Pulmonary effort is normal.     Breath sounds: Normal breath sounds.  No wheezing, rhonchi or rales.  Genitourinary:    Cervix: No cervical motion tenderness, discharge or friability.     Uterus: Not enlarged, not fixed and not tender.      Adnexa:        Right: No mass, tenderness or fullness.         Left: No mass, tenderness or fullness.       Comments: Pap performed. No CMT. Unable to appreciated ovaries. Lymphadenopathy:     Head:     Right side of head: No submental, submandibular, tonsillar, preauricular, posterior auricular or occipital adenopathy.     Left side of head: No submental, submandibular, tonsillar, preauricular, posterior auricular or occipital adenopathy.  Skin:    General: Skin is warm and dry.  Neurological:     Mental Status: She is alert.  Psychiatric:        Speech: Speech normal.        Behavior: Behavior normal.        Thought Content: Thought content normal.        Assessment & Plan:   Problem List Items Addressed This Visit      Cardiovascular and Mediastinum   Benign essential HTN - Primary    BP Readings from Last 3 Encounters:  07/14/19 132/82  06/30/19 (!) 164/98  08/04/18 (!) 160/76   Not quite at goal, increase losartan. Patient to monitor at home.       Relevant Medications   losartan (COZAAR) 100 MG tablet   Other Relevant Orders   Basic metabolic panel     Other   Screening for cervical cancer    Pap performed. Referral to GYN to discuss implanon      Relevant Orders   Cytology - PAP   Ambulatory referral to Obstetrics / Gynecology       I have discontinued Joelene MillinKimberly M. Antwine's losartan. I am also having her start on losartan. Additionally, I am having her maintain her glucose blood, rosuvastatin, empagliflozin, metFORMIN, amphetamine-dextroamphetamine, Basaglar KwikPen, liraglutide, amLODipine, and sertraline.   Meds ordered this encounter  Medications  . losartan (COZAAR) 100 MG tablet    Sig: Take 1 tablet (100 mg total) by mouth daily.    Dispense:  90 tablet    Refill:  3     Order Specific Question:   Supervising Provider    Answer:   Sherlene ShamsULLO, TERESA L [[id number]]    Return precautions given.   Risks, benefits, and alternatives of the medications and treatment plan prescribed today were discussed, and patient  expressed understanding.   Education regarding symptom management and diagnosis given to patient on AVS.   Continue to follow with Allegra Grana,  G, FNP for routine health maintenance.   Jacolyn ReedyKimberly M Kulzer and I agreed with plan.   Rennie Plowman , FNP

## 2019-07-14 NOTE — Assessment & Plan Note (Signed)
Pap performed. Referral to GYN to discuss implanon

## 2019-07-14 NOTE — Patient Instructions (Addendum)
Double check on the metformin xr and ensure NOT recalled  Increase losartan to 100mg  to be taken in the evening. Labs in one week.   Bring stool cards back.   Monitor blood pressure,  Goal is less than 120/80, based on newest guidelines; if persistently higher, please make sooner follow up appointment so we can recheck you blood pressure and manage medications  Today we discussed referrals, orders. GYN to discuss birth control options   I have placed these orders in the system for you.  Please be sure to give us a call if you have not heard from our office regarding this. We should hear from us within ONE week with information regarding your appointment. If not, please let me know immediately.      Stay safe!   Health Maintenance, Female Adopting a healthy lifestyle and getting preventive care are important in promoting health and wellness. Ask your health care provider about:  The right schedule for you to have regular tests and exams.  Things you can do on your own to prevent diseases and keep yourself healthy. What should I know about diet, weight, and exercise? Eat a healthy diet   Eat a diet that includes plenty of vegetables, fruits, low-fat dairy products, and lean protein.  Do not eat a lot of foods that are high in solid fats, added sugars, or sodium. Maintain a healthy weight Body mass index (BMI) is used to identify weight problems. It estimates body fat based on height and weight. Your health care provider can help determine your BMI and help you achieve or maintain a healthy weight. Get regular exercise Get regular exercise. This is one of the most important things you can do for your health. Most adults should:  Exercise for at least 150 minutes each week. The exercise should increase your heart rate and make you sweat (moderate-intensity exercise).  Do strengthening exercises at least twice a week. This is in addition to the moderate-intensity exercise.  Spend  less time sitting. Even light physical activity can be beneficial. Watch cholesterol and blood lipids Have your blood tested for lipids and cholesterol at 40 years of age, then have this test every 5 years. Have your cholesterol levels checked more often if:  Your lipid or cholesterol levels are high.  You are older than 40 years of age.  You are at high risk for heart disease. What should I know about cancer screening? Depending on your health history and family history, you may need to have cancer screening at various ages. This may include screening for:  Breast cancer.  Cervical cancer.  Colorectal cancer.  Skin cancer.  Lung cancer. What should I know about heart disease, diabetes, and high blood pressure? Blood pressure and heart disease  High blood pressure causes heart disease and increases the risk of stroke. This is more likely to develop in people who have high blood pressure readings, are of African descent, or are overweight.  Have your blood pressure checked: ? Every 3-5 years if you are 7518-40 years of age. ? Every year if you are 40 years old or older. Diabetes Have regular diabetes screenings. This checks your fasting blood sugar level. Have the screening done:  Once every three years after age 40 if you are at a normal weight and have a low risk for diabetes.  More often and at a younger age if you are overweight or have a high risk for diabetes. What should I know about preventing infection? Hepatitis  B If you have a higher risk for hepatitis B, you should be screened for this virus. Talk with your health care provider to find out if you are at risk for hepatitis B infection. Hepatitis C Testing is recommended for:  Everyone born from [birth year] through 1967.  Anyone with known risk factors for hepatitis C. Sexually transmitted infections (STIs)  Get screened for STIs, including gonorrhea and chlamydia, if: ? You are sexually active and are younger than 40  years of age. ? You are older than 40 years of age and your health care provider tells you that you are at risk for this type of infection. ? Your sexual activity has changed since you were last screened, and you are at increased risk for chlamydia or gonorrhea. Ask your health care provider if you are at risk.  Ask your health care provider about whether you are at high risk for HIV. Your health care provider may recommend a prescription medicine to help prevent HIV infection. If you choose to take medicine to prevent HIV, you should first get tested for HIV. You should then be tested every 3 months for as long as you are taking the medicine. Pregnancy  If you are about to stop having your period (premenopausal) and you may become pregnant, seek counseling before you get pregnant.  Take 400 to 800 micrograms (mcg) of folic acid every day if you become pregnant.  Ask for birth control (contraception) if you want to prevent pregnancy. Osteoporosis and menopause Osteoporosis is a disease in which the bones lose minerals and strength with aging. This can result in bone fractures. If you are 60 years old or older, or if you are at risk for osteoporosis and fractures, ask your health care provider if you should:  Be screened for bone loss.  Take a calcium or vitamin D supplement to lower your risk of fractures.  Be given hormone replacement therapy (HRT) to treat symptoms of menopause. Follow these instructions at home: Lifestyle  Do not use any products that contain nicotine or tobacco, such as cigarettes, e-cigarettes, and chewing tobacco. If you need help quitting, ask your health care provider.  Do not use street drugs.  Do not share needles.  Ask your health care provider for help if you need support or information about quitting drugs. Alcohol use  Do not drink alcohol if: ? Your health care provider tells you not to drink. ? You are pregnant, may be pregnant, or are planning to  become pregnant.  If you drink alcohol: ? Limit how much you use to 0-1 drink a day. ? Limit intake if you are breastfeeding.  Be aware of how much alcohol is in your drink. In the [place] one drink equals one 12 oz bottle of beer (355 mL), one 5 oz glass of wine (148 mL), or one 1 oz glass of hard liquor (44 mL). General instructions  Schedule regular health, dental, and eye exams.  Stay current with your vaccines.  Tell your health care provider if: ? You often feel depressed. ? You have ever been abused or do not feel safe at home. Summary  Adopting a healthy lifestyle and getting preventive care are important in promoting health and wellness.  Follow your health care provider's instructions about healthy diet, exercising, and getting tested or screened for diseases.  Follow your health care provider's instructions on monitoring your cholesterol and blood pressure. This information is not intended to replace advice given to you by your health  care provider. Make sure you discuss any questions you have with your health care provider. Document Released: 06/19/2011 Document Revised: 11/27/2018 Document Reviewed: 11/27/2018 Elsevier Patient Education  2020 [employer].

## 2019-07-15 LAB — CYTOLOGY - PAP
Adequacy: ABSENT
Diagnosis: NEGATIVE
HPV: NOT DETECTED

## 2019-07-21 ENCOUNTER — Other Ambulatory Visit: Payer: BC Managed Care – PPO

## 2019-08-01 ENCOUNTER — Telehealth: Payer: Self-pay

## 2019-08-01 NOTE — Telephone Encounter (Signed)
Coronavirus (COVID-19) Are you at risk?  Are you at risk for the Coronavirus (COVID-19)?  To be considered HIGH RISK for Coronavirus (COVID-19), you have to meet the following criteria:  . Traveled to [place], [place], [place], [place] or [place]; or in the [place] to [place], [place], [place], or [place]; and have fever, cough, and shortness of breath within the last 2 weeks of travel OR . Been in close contact with a person diagnosed with COVID-19 within the last 2 weeks and have fever, cough, and shortness of breath . IF YOU DO NOT MEET THESE CRITERIA, YOU ARE CONSIDERED LOW RISK FOR COVID-19.  What to do if you are HIGH RISK for COVID-19?  . If you are having a medical emergency, call 911. . Seek medical care right away. Before you go to a doctor's office, urgent care or emergency department, call ahead and tell them about your recent travel, contact with someone diagnosed with COVID-19, and your symptoms. You should receive instructions from your physician's office regarding next steps of care.  . When you arrive at healthcare provider, tell the healthcare staff immediately you have returned from visiting [place], [place], [place], [place] or [place]; or traveled in the [place] to [place], [place], [place], or [place]; in the last two weeks or you have been in close contact with a person diagnosed with COVID-19 in the last 2 weeks.   . Tell the health care staff about your symptoms: fever, cough and shortness of breath. . After you have been seen by a medical provider, you will be either: o Tested for (COVID-19) and discharged home on quarantine except to seek medical care if symptoms worsen, and asked to  - Stay home and avoid contact with others until you get your results (4-5 days)  - Avoid travel on public transportation if possible (such as bus, train, or airplane) or o Sent to the Emergency Department by EMS for evaluation, COVID-19 testing, and possible  admission depending on your condition and test results.  What to do if you are LOW RISK for COVID-19?  Reduce your risk of any infection by using the same precautions used for avoiding the common cold or flu:  . Wash your hands often with soap and warm water for at least 20 seconds.  If soap and water are not readily available, use an alcohol-based hand sanitizer with at least 60% alcohol.  . If coughing or sneezing, cover your mouth and nose by coughing or sneezing into the elbow areas of your shirt or coat, into a tissue or into your sleeve (not your hands). . Avoid shaking hands with others and consider head nods or verbal greetings only. . Avoid touching your eyes, nose, or mouth with unwashed hands.  . Avoid close contact with people who are Shedric Fredericks. . Avoid places or events with large numbers of people in one location, like concerts or sporting events. . Carefully consider travel plans you have or are making. . If you are planning any travel outside or inside the [place], visit the CDC's Travelers' Health webpage for the latest health notices. . If you have some symptoms but not all symptoms, continue to monitor at home and seek medical attention if your symptoms worsen. . If you are having a medical emergency, call 911.  08/01/19 SCREENING NEG SLS ADDITIONAL HEALTHCARE OPTIONS FOR PATIENTS  [facility] / e-Visit: [email or web address]         [facility]: [phone]    [facility]: [phone]                   [facility]: [phone]  

## 2019-08-04 ENCOUNTER — Other Ambulatory Visit: Payer: Self-pay

## 2019-08-04 ENCOUNTER — Encounter: Payer: Self-pay | Admitting: Certified Nurse Midwife

## 2019-08-04 ENCOUNTER — Ambulatory Visit (INDEPENDENT_AMBULATORY_CARE_PROVIDER_SITE_OTHER): Payer: BC Managed Care – PPO | Admitting: Certified Nurse Midwife

## 2019-08-04 VITALS — BP 116/73 | HR 105 | Ht 64.0 in | Wt 179.5 lb

## 2019-08-04 DIAGNOSIS — Z3009 Encounter for other general counseling and advice on contraception: Secondary | ICD-10-CM

## 2019-08-04 NOTE — Patient Instructions (Signed)

## 2019-08-04 NOTE — Progress Notes (Signed)
Subjective:    Carol [name] is a 38 y.o. female who presents for contraception counseling. The patient has no complaints today. The patient is sexually active. Pertinent past medical history: hypertension, diabetes.   Menstrual History: OB History   No obstetric history on file.        The following portions of the patient's history were reviewed and updated as appropriate: allergies, current medications, past family history, past medical history, past social history, past surgical history and problem list.  Review of Systems Pertinent items are noted in HPI.   Objective:    No exam performed today, not indicated for birth control.   Assessment:    38 y.o., starting Nexplanon, no contraindications.   Plan:  Reviewed all forms of birth control options available including abstinence; fertility period awareness methods; over the counter/barrier methods; hormonal contraceptive medication including pill, patch, ring, injection,contraceptive implant; hormonal and nonhormonal IUDs; permanent sterilization options including vasectomy and the various tubal sterilization modalities. Risks and benefits reviewed.  Questions were answered.  Information was given to patient to review.   All questions answered.  given her medical history estrogen & progesterone methods excluded, as well as the depo injections. She is interested in [place]. Reviewed risks and benefits , discussed procedure. Pt encouraged to take motrin 600 mg q 6 hrs to help if she has bleeding irregularities. She verbalizes and agress. She will return PRN for placement.   I attest more than 50% of visit spent discussing medical history, reviewing bc options, discussing risks and benefits of varius methods , and reviewing procedures for placement. All questions answered Face to face time 20 min.   Philip Aspen, CNM

## 2019-08-12 ENCOUNTER — Ambulatory Visit: Payer: BC Managed Care – PPO | Admitting: Certified Nurse Midwife

## 2019-08-29 ENCOUNTER — Other Ambulatory Visit: Payer: Self-pay

## 2019-08-29 ENCOUNTER — Ambulatory Visit (INDEPENDENT_AMBULATORY_CARE_PROVIDER_SITE_OTHER): Payer: BC Managed Care – PPO | Admitting: Certified Nurse Midwife

## 2019-08-29 ENCOUNTER — Encounter: Payer: Self-pay | Admitting: Certified Nurse Midwife

## 2019-08-29 VITALS — BP 123/80 | HR 104 | Ht 64.0 in | Wt 179.1 lb

## 2019-08-29 DIAGNOSIS — Z3046 Encounter for surveillance of implantable subdermal contraceptive: Secondary | ICD-10-CM | POA: Diagnosis not present

## 2019-08-29 DIAGNOSIS — Z30017 Encounter for initial prescription of implantable subdermal contraceptive: Secondary | ICD-10-CM

## 2019-08-29 DIAGNOSIS — Z3202 Encounter for pregnancy test, result negative: Secondary | ICD-10-CM

## 2019-08-29 LAB — POCT URINE PREGNANCY: Preg Test, Ur: NEGATIVE

## 2019-08-29 NOTE — Addendum Note (Signed)
Addended by: Raliegh Ip on: 08/29/2019 10:49 AM   Modules accepted: Orders

## 2019-08-29 NOTE — Patient Instructions (Signed)
Nexplanon Instructions After Insertion  Keep bandage clean and dry for 24 hours  May use ice/Tylenol/Ibuprofen for soreness or pain  If you develop fever, drainage or increased warmth from incision site-contact office immediately   

## 2019-08-29 NOTE — Progress Notes (Signed)
Carol [name] is a 38 y.o. year old African American female here for Nexplanon insertion.  Patient's last menstrual period was 08/19/2019 (exact date)., her pregnancy test today was negative.  Risks/benefits/side effects of Nexplanon have been discussed and her questions have been answered.  Specifically, a failure rate of 12/998 has been reported, with an increased failure rate if pt takes [place] and/or antiseizure medicaitons.  Carol [name] is aware of the common side effect of irregular bleeding, which the incidence of decreases over time.  BP 123/80   Pulse (!) 104   Ht 5\' 4"  (1.626 m)   Wt 179 lb 1 oz (81.2 kg)   LMP 08/19/2019 (Exact Date)   BMI 30.74 kg/m   No results found for this or any previous visit (from the past 24 hour(s)).   She is right-handed, so her left arm, approximately 4 inches proximal from the elbow, was cleansed with alcohol and anesthetized with 2cc of 2% Lidocaine.  The area was cleansed again with betadine and the Nexplanon was inserted per manufacturer's recommendations without difficulty.  A steri-strip and pressure bandage were applied.  Pt was instructed to keep the area clean and dry, remove pressure bandage in 24 hours, and keep insertion site covered with the steri-strip for 3-5 days.  Back up contraception was recommended for 2 weeks.  She was given a card indicating date Nexplanon was inserted and date it needs to be removed. Follow-up PRN problems.  Deneise Lever Kerilyn Cortner,CNM

## 2019-09-17 ENCOUNTER — Ambulatory Visit
Admission: RE | Admit: 2019-09-17 | Discharge: 2019-09-17 | Disposition: A | Discharge status: Home / Self Care | Payer: BC Managed Care – PPO | Source: Ambulatory Visit | Attending: Family | Admitting: Family

## 2019-09-17 DIAGNOSIS — Z1231 Encounter for screening mammogram for malignant neoplasm of breast: Secondary | ICD-10-CM | POA: Insufficient documentation

## 2019-09-17 DIAGNOSIS — Z0000 Encounter for general adult medical examination without abnormal findings: Secondary | ICD-10-CM

## 2019-09-20 ENCOUNTER — Other Ambulatory Visit: Payer: Self-pay | Admitting: Family

## 2019-09-24 ENCOUNTER — Other Ambulatory Visit: Payer: Self-pay | Admitting: Family

## 2019-09-24 DIAGNOSIS — F909 Attention-deficit hyperactivity disorder, unspecified type: Secondary | ICD-10-CM

## 2019-09-25 MED ORDER — AMPHETAMINE-DEXTROAMPHET ER 20 MG PO CP24: 20.0000 mg | ORAL_CAPSULE | Freq: Every day | ORAL | 0 refills | Status: DC

## 2019-11-07 ENCOUNTER — Other Ambulatory Visit: Payer: Self-pay | Admitting: Internal Medicine

## 2019-11-07 DIAGNOSIS — F909 Attention-deficit hyperactivity disorder, unspecified type: Secondary | ICD-10-CM

## 2019-11-09 MED ORDER — AMPHETAMINE-DEXTROAMPHET ER 20 MG PO CP24: 20.0000 mg | ORAL_CAPSULE | Freq: Every day | ORAL | 0 refills | Status: DC

## 2019-12-18 ENCOUNTER — Other Ambulatory Visit: Payer: Self-pay | Admitting: Internal Medicine

## 2019-12-18 DIAGNOSIS — F909 Attention-deficit hyperactivity disorder, unspecified type: Secondary | ICD-10-CM

## 2019-12-18 NOTE — Telephone Encounter (Signed)
Refilled: 11/09/2019 Last OV: 07/14/2019 Next OV: 02/13/2020

## 2019-12-22 MED ORDER — AMPHETAMINE-DEXTROAMPHET ER 20 MG PO CP24: 20.0000 mg | ORAL_CAPSULE | Freq: Every day | ORAL | 0 refills | Status: DC

## 2019-12-22 NOTE — Telephone Encounter (Signed)
Call pt   I have refilled your adderall for 2 months  However I wanted to remind you that this is controlled substance.   In order for me to prescribe medication,  patients must be seen every 3 months.   Please make follow-up appointment this month for any further refills.    I looked up patient on [place] Controlled Substances Reporting System and saw no activity that raised concern of inappropriate use.

## 2019-12-23 NOTE — Telephone Encounter (Signed)
Mychart message sent.

## 2019-12-24 ENCOUNTER — Other Ambulatory Visit: Payer: Self-pay | Admitting: Family

## 2019-12-24 DIAGNOSIS — F419 Anxiety disorder, unspecified: Secondary | ICD-10-CM

## 2019-12-24 DIAGNOSIS — F329 Major depressive disorder, single episode, unspecified: Secondary | ICD-10-CM

## 2020-02-13 ENCOUNTER — Other Ambulatory Visit: Payer: Self-pay

## 2020-02-13 ENCOUNTER — Other Ambulatory Visit (INDEPENDENT_AMBULATORY_CARE_PROVIDER_SITE_OTHER): Payer: 59

## 2020-02-13 ENCOUNTER — Ambulatory Visit: Payer: 59 | Admitting: Family

## 2020-02-13 ENCOUNTER — Other Ambulatory Visit (HOSPITAL_COMMUNITY)
Admission: RE | Admit: 2020-02-13 | Discharge: 2020-02-13 | Disposition: A | Discharge status: Home / Self Care | Payer: 59 | Source: Ambulatory Visit | Attending: Family | Admitting: Family

## 2020-02-13 ENCOUNTER — Other Ambulatory Visit: Payer: Self-pay | Admitting: *Deleted

## 2020-02-13 ENCOUNTER — Encounter: Payer: Self-pay | Admitting: Family

## 2020-02-13 VITALS — BP 110/82 | HR 96 | Temp 97.5°F | Ht 65.25 in | Wt 184.4 lb

## 2020-02-13 DIAGNOSIS — R87615 Unsatisfactory cytologic smear of cervix: Secondary | ICD-10-CM | POA: Insufficient documentation

## 2020-02-13 DIAGNOSIS — Z794 Long term (current) use of insulin: Secondary | ICD-10-CM

## 2020-02-13 DIAGNOSIS — I10 Essential (primary) hypertension: Secondary | ICD-10-CM | POA: Diagnosis not present

## 2020-02-13 DIAGNOSIS — Z1211 Encounter for screening for malignant neoplasm of colon: Secondary | ICD-10-CM

## 2020-02-13 DIAGNOSIS — E119 Type 2 diabetes mellitus without complications: Secondary | ICD-10-CM | POA: Diagnosis not present

## 2020-02-13 DIAGNOSIS — Z124 Encounter for screening for malignant neoplasm of cervix: Secondary | ICD-10-CM

## 2020-02-13 DIAGNOSIS — Z6830 Body mass index (BMI) 30.0-30.9, adult: Secondary | ICD-10-CM

## 2020-02-13 DIAGNOSIS — E669 Obesity, unspecified: Secondary | ICD-10-CM | POA: Diagnosis not present

## 2020-02-13 DIAGNOSIS — E785 Hyperlipidemia, unspecified: Secondary | ICD-10-CM

## 2020-02-13 DIAGNOSIS — R87619 Unspecified abnormal cytological findings in specimens from cervix uteri: Secondary | ICD-10-CM | POA: Insufficient documentation

## 2020-02-13 LAB — CBC WITH DIFFERENTIAL/PLATELET
Basophils Absolute: 0.1 10*3/uL (ref 0.0–0.1)
Basophils Relative: 1.1 % (ref 0.0–3.0)
Eosinophils Absolute: 0.1 10*3/uL (ref 0.0–0.7)
Eosinophils Relative: 2.1 % (ref 0.0–5.0)
HCT: 34.5 % — ABNORMAL LOW (ref 36.0–46.0)
Hemoglobin: 11.3 g/dL — ABNORMAL LOW (ref 12.0–15.0)
Lymphocytes Relative: 21.8 % (ref 12.0–46.0)
Lymphs Abs: 1.3 10*3/uL (ref 0.7–4.0)
MCHC: 32.7 g/dL (ref 30.0–36.0)
MCV: 79.3 fl (ref 78.0–100.0)
Monocytes Absolute: 0.5 10*3/uL (ref 0.1–1.0)
Monocytes Relative: 7.7 % (ref 3.0–12.0)
Neutro Abs: 4 10*3/uL (ref 1.4–7.7)
Neutrophils Relative %: 67.3 % (ref 43.0–77.0)
Platelets: 385 10*3/uL (ref 150.0–400.0)
RBC: 4.34 Mil/uL (ref 3.87–5.11)
RDW: 14.2 % (ref 11.5–15.5)
WBC: 5.9 10*3/uL (ref 4.0–10.5)

## 2020-02-13 LAB — COMPREHENSIVE METABOLIC PANEL
ALT: 9 U/L (ref 0–35)
AST: 11 U/L (ref 0–37)
Albumin: 3.8 g/dL (ref 3.5–5.2)
Alkaline Phosphatase: 88 U/L (ref 39–117)
BUN: 14 mg/dL (ref 6–23)
CO2: 29 mEq/L (ref 19–32)
Calcium: 9.3 mg/dL (ref 8.4–10.5)
Chloride: 102 mEq/L (ref 96–112)
Creatinine, Ser: 0.75 mg/dL (ref 0.40–1.20)
GFR: 103.24 mL/min (ref >60.00)
Glucose, Bld: 187 mg/dL — ABNORMAL HIGH (ref 70–99)
Potassium: 3.9 mEq/L (ref 3.5–5.1)
Sodium: 135 mEq/L (ref 135–145)
Total Bilirubin: 0.4 mg/dL (ref 0.2–1.2)
Total Protein: 8.1 g/dL (ref 6.0–8.3)

## 2020-02-13 LAB — HEMOGLOBIN A1C: Hgb A1c MFr Bld: 8.2 % — ABNORMAL HIGH (ref 4.6–6.5)

## 2020-02-13 LAB — LIPID PANEL
Cholesterol: 202 mg/dL — ABNORMAL HIGH (ref 0–200)
HDL: 46.4 mg/dL (ref >39.00)
LDL Cholesterol: 141 mg/dL — ABNORMAL HIGH (ref 0–99)
NonHDL: 155.66
Total CHOL/HDL Ratio: 4
Triglycerides: 72 mg/dL (ref 0.0–149.0)
VLDL: 14.4 mg/dL (ref 0.0–40.0)

## 2020-02-13 LAB — FECAL OCCULT BLOOD, IMMUNOCHEMICAL: Fecal Occult Bld: NEGATIVE

## 2020-02-13 MED ORDER — FREESTYLE LIBRE 2 SENSOR MISC: 2.0000 | 4 refills | Status: AC

## 2020-02-13 MED ORDER — FREESTYLE LIBRE 2 READER DEVI: 1.0000 | Freq: Every day | 0 refills | Status: DC

## 2020-02-13 MED ORDER — ROSUVASTATIN CALCIUM 20 MG PO TABS: 20.0000 mg | ORAL_TABLET | Freq: Every day | ORAL | 2 refills | Status: DC

## 2020-02-13 NOTE — Patient Instructions (Signed)
Always a pleasure to see you!  

## 2020-02-13 NOTE — Assessment & Plan Note (Addendum)
Prior Pap with insufficient transformation zone.  Repeated Pap.  If insufficient again, patient I agreed to refer her to a female OB/GYN

## 2020-02-13 NOTE — Progress Notes (Signed)
I called patient & let know that I have sent to [facility] for her. I did not know if insurance would cover, but I would be happy to do PA if needed. I also asked her to call if she needed a nurse visit for teaching & I would be happy to do so.

## 2020-02-13 NOTE — Progress Notes (Signed)
Subjective:    Patient ID: Carol [name], female    DOB: [birth year], 39 y.o.   MRN: [id number]  CC: Carol [name] is a 39 y.o. female who presents today for follow up.   HPI: Feels well today.  Here to follow-up for Pap smear which revealed no transformation zone last year.  She currently has a Nexplanon. Hypertension-Compliant with medication.  No chest pain.  She is walking daily.    Diabetes-compliant with glargine ; she will take 35 units if fasting blood glucose of between 115-25.  She notes she has been having excursions of late due to late night eating , fasting blood glucose closer to 180, she will then take 50 units in the morning.  She stopped Jardiance as she had episode of hypoglycemia of 50.  This is since resolved.  Anxiety and depression -she feels well on the Zoloft.  She is sleeping better on this medication.  Denies thoughts of hurting herself or anyone else.   HISTORY:  Past Medical History:  Diagnosis Date  . Attention deficit disorder (ADD)   . Diabetes mellitus without complication (HCC) 2009  . Hypertension   . Subglottic stenosis    Trach collar   Past Surgical History:  Procedure Laterality Date  . BREAST BIOPSY Right    benign  . BREAST BIOPSY Left 10/2017   fibroadeoma  . BREAST BIOPSY Left 10/2017   fibroadenoma  . TRACHEOSTOMY  2010   Family History  Problem Relation Age of Onset  . Diabetes Father   . Hypertension Father   . Hypertension Brother   . Breast cancer Neg Hx   . Thyroid cancer Neg Hx     Allergies: Lac bovis Current Outpatient Medications on File Prior to Visit  Medication Sig Dispense Refill  . amLODipine (NORVASC) 10 MG tablet TAKE 1 TABLET BY MOUTH DAILY 90 tablet 2  . amphetamine-dextroamphetamine (ADDERALL XR) 20 MG 24 hr capsule Take 1 capsule (20 mg total) by mouth daily. 30 capsule 0  . etonogestrel (NEXPLANON) 68 MG IMPL implant 1 each by Subdermal route once.    Marland Kitchen glucose blood test strip Use as instructed  up to 2 times per day 100 each 12  . Insulin Glargine (BASAGLAR KWIKPEN) 100 UNIT/ML SOPN INJECT 0.5 MLS (50 UNITS   TOTAL) INTO THE SKIN DAILY AT 10PM 45 mL 1  . liraglutide (VICTOZA) 18 MG/3ML SOPN Inject 0.1 mLs (0.6 mg total) into the skin daily. Start 0.6mg  [place] QD x 1 week, then 1.2mg  [place] qd. 3 pen 2  . losartan (COZAAR) 100 MG tablet Take 1 tablet (100 mg total) by mouth daily. 90 tablet 3  . metFORMIN (GLUCOPHAGE XR) 500 MG 24 hr tablet Start 500mg  PO qpm. 90 tablet 1  . sertraline (ZOLOFT) 100 MG tablet TAKE 1 TABLET BY MOUTH EVERYDAY AT BEDTIME 90 tablet 1  . sertraline (ZOLOFT) 50 MG tablet TAKE 1 TABLET BY MOUTH AT BEDTIME 90 tablet 3   No current facility-administered medications on file prior to visit.    Social History   Tobacco Use  . Smoking status: Former Smoker    Packs/day: 0.25    Years: 3.00    Pack years: 0.75    Types: Cigarettes    Quit date: 02/17/2002    Years since quitting: 18.0  . Smokeless tobacco: Never Used  Substance Use Topics  . Alcohol use: Yes    Alcohol/week: 0.0 standard drinks    Comment: Rare - 2  year  .  Drug use: No    Review of Systems  Constitutional: Negative for chills and fever.  Respiratory: Negative for cough.   Cardiovascular: Negative for chest pain and palpitations.  Gastrointestinal: Negative for nausea and vomiting.  Psychiatric/Behavioral: Negative for sleep disturbance and suicidal ideas. The patient is not nervous/anxious.       Objective:    BP 110/82   Pulse 96   Temp (!) 97.5 F (36.4 C)   Ht 5' 5.25" (1.657 m)   Wt 184 lb 6.4 oz (83.6 kg)   SpO2 97%   BMI 30.45 kg/m  BP Readings from Last 3 Encounters:  02/13/20 110/82  08/29/19 123/80  08/04/19 116/73   Wt Readings from Last 3 Encounters:  02/13/20 184 lb 6.4 oz (83.6 kg)  08/29/19 179 lb 1 oz (81.2 kg)  08/04/19 179 lb 8 oz (81.4 kg)    Physical Exam Vitals reviewed.  Constitutional:      Appearance: She is well-developed.  Eyes:      Conjunctiva/sclera: Conjunctivae normal.  Neck:     Thyroid: No thyroid mass or thyromegaly.  Cardiovascular:     Rate and Rhythm: Normal rate and regular rhythm.     Pulses: Normal pulses.     Heart sounds: Normal heart sounds.  Pulmonary:     Effort: Pulmonary effort is normal.     Breath sounds: Normal breath sounds. No wheezing, rhonchi or rales.  Genitourinary:    Cervix: No cervical motion tenderness, discharge or friability.     Uterus: Not enlarged, not fixed and not tender.      Comments: Pap performed.  Difficulty in collecting specimen due to small vaginal canal.  Used pediatric specimen.  Skin:    General: Skin is warm and dry.  Neurological:     Mental Status: She is alert.  Psychiatric:        Speech: Speech normal.        Behavior: Behavior normal.        Thought Content: Thought content normal.        Assessment & Plan:   Problem List Items Addressed This Visit      Cardiovascular and Mediastinum   Benign essential HTN    Well-controlled, continue current regimen.        Endocrine   DM type 2 (diabetes mellitus, type 2) ([place]) - Primary    Unsure if controlled.  Pending A1c.  Discussed increasing Victoza.  Advised to remain off of Jardiance.  Also discussed the libre with alarms sensor given hypoglycemic episode. We will order this for patient      Relevant Orders   CBC with Differential/Platelet   Comprehensive metabolic panel   Hemoglobin A1c   Lipid panel     Other   Abnormal Pap smear of cervix   Relevant Orders   Cytology - PAP   Obesity    Some increase in weight.  Referral to Redgie Grayer      Screening for cervical cancer    Prior Pap with insufficient transformation zone.  Repeated Pap.  If insufficient again, patient I agreed to refer her to a female OB/GYN       Other Visit Diagnoses    Obesity (BMI 30-39.9)       Relevant Orders   Amb Ref to Medical Weight Management       I have discontinued Luane [employer]. Piscitelli's  empagliflozin. I am also having her maintain her glucose blood, metFORMIN, Basaglar KwikPen, liraglutide, amLODipine, losartan, Nexplanon, sertraline, amphetamine-dextroamphetamine,  and sertraline.   No orders of the defined types were placed in this encounter.   Return precautions given.   Risks, benefits, and alternatives of the medications and treatment plan prescribed today were discussed, and patient expressed understanding.   Education regarding symptom management and diagnosis given to patient on AVS.  Continue to follow with Allegra Grana, FNP for routine health maintenance.   Carol [name] and I agreed with plan.   Rennie Plowman, FNP

## 2020-02-13 NOTE — Assessment & Plan Note (Signed)
Unsure if controlled.  Pending A1c.  Discussed increasing Victoza.  Advised to remain off of Jardiance.  Also discussed the libre with alarms sensor given hypoglycemic episode. We will order this for patient

## 2020-02-13 NOTE — Assessment & Plan Note (Signed)
Well-controlled, continue current regimen 

## 2020-02-13 NOTE — Assessment & Plan Note (Signed)
Some increase in weight.  Referral to Orlene Plum

## 2020-02-15 ENCOUNTER — Other Ambulatory Visit: Payer: Self-pay | Admitting: Family

## 2020-02-15 DIAGNOSIS — D649 Anemia, unspecified: Secondary | ICD-10-CM

## 2020-02-16 ENCOUNTER — Other Ambulatory Visit: Payer: Self-pay

## 2020-02-16 ENCOUNTER — Other Ambulatory Visit: Payer: Self-pay | Admitting: Family

## 2020-02-16 ENCOUNTER — Telehealth: Payer: Self-pay | Admitting: Family

## 2020-02-16 DIAGNOSIS — Z794 Long term (current) use of insulin: Secondary | ICD-10-CM

## 2020-02-16 DIAGNOSIS — E118 Type 2 diabetes mellitus with unspecified complications: Secondary | ICD-10-CM

## 2020-02-16 DIAGNOSIS — F909 Attention-deficit hyperactivity disorder, unspecified type: Secondary | ICD-10-CM

## 2020-02-16 MED ORDER — AMPHETAMINE-DEXTROAMPHET ER 20 MG PO CP24: 20.0000 mg | ORAL_CAPSULE | Freq: Every day | ORAL | 0 refills | Status: DC

## 2020-02-16 MED ORDER — ROSUVASTATIN CALCIUM 40 MG PO TABS: 40.0000 mg | ORAL_TABLET | Freq: Every day | ORAL | 3 refills | Status: DC

## 2020-02-16 MED ORDER — LIRAGLUTIDE 18 MG/3ML ~~LOC~~ SOPN: 1.2000 mg | PEN_INJECTOR | Freq: Every day | SUBCUTANEOUS | 2 refills | Status: DC

## 2020-02-16 NOTE — Telephone Encounter (Signed)
Call pt Schedule her for ferritin lab ( iron studies) due to anemia seen in labs.  It was not able to be added onto labs from last week unfortunately

## 2020-02-16 NOTE — Telephone Encounter (Signed)
I have called & scheduled patient for iron studies. She asked if you would fill her adderall as well. Was last filled 12/22/19 with no refills.

## 2020-02-16 NOTE — Telephone Encounter (Signed)
I looked up patient on [place] Controlled Substances Reporting System and saw no activity that raised concern of inappropriate use.   

## 2020-02-17 ENCOUNTER — Other Ambulatory Visit: Payer: Self-pay

## 2020-02-17 ENCOUNTER — Other Ambulatory Visit (INDEPENDENT_AMBULATORY_CARE_PROVIDER_SITE_OTHER): Payer: 59

## 2020-02-17 DIAGNOSIS — Z794 Long term (current) use of insulin: Secondary | ICD-10-CM

## 2020-02-17 DIAGNOSIS — D649 Anemia, unspecified: Secondary | ICD-10-CM

## 2020-02-17 DIAGNOSIS — E118 Type 2 diabetes mellitus with unspecified complications: Secondary | ICD-10-CM

## 2020-02-17 LAB — CYTOLOGY - PAP
Adequacy: ABSENT
Comment: NEGATIVE
Diagnosis: NEGATIVE
High risk HPV: NEGATIVE

## 2020-02-18 ENCOUNTER — Other Ambulatory Visit: Payer: Self-pay

## 2020-02-18 ENCOUNTER — Encounter: Payer: Self-pay | Admitting: Family

## 2020-02-18 DIAGNOSIS — E118 Type 2 diabetes mellitus with unspecified complications: Secondary | ICD-10-CM

## 2020-02-18 DIAGNOSIS — Z794 Long term (current) use of insulin: Secondary | ICD-10-CM

## 2020-02-18 LAB — COMPREHENSIVE METABOLIC PANEL
ALT: 7 U/L (ref 0–35)
AST: 11 U/L (ref 0–37)
Albumin: 3.8 g/dL (ref 3.5–5.2)
Alkaline Phosphatase: 91 U/L (ref 39–117)
BUN: 13 mg/dL (ref 6–23)
CO2: 27 mEq/L (ref 19–32)
Calcium: 9 mg/dL (ref 8.4–10.5)
Chloride: 101 mEq/L (ref 96–112)
Creatinine, Ser: 0.66 mg/dL (ref 0.40–1.20)
GFR: 119.64 mL/min (ref >60.00)
Glucose, Bld: 132 mg/dL — ABNORMAL HIGH (ref 70–99)
Potassium: 3.7 mEq/L (ref 3.5–5.1)
Sodium: 135 mEq/L (ref 135–145)
Total Bilirubin: 0.3 mg/dL (ref 0.2–1.2)
Total Protein: 8 g/dL (ref 6.0–8.3)

## 2020-02-18 LAB — IBC + FERRITIN
Ferritin: 14.8 ng/mL (ref 10.0–291.0)
Iron: 40 ug/dL — ABNORMAL LOW (ref 42–145)
Saturation Ratios: 9.9 % — ABNORMAL LOW (ref 20.0–50.0)
Transferrin: 290 mg/dL (ref 212.0–360.0)

## 2020-02-18 MED ORDER — LIRAGLUTIDE 18 MG/3ML ~~LOC~~ SOPN: 1.2000 mg | PEN_INJECTOR | Freq: Every day | SUBCUTANEOUS | 2 refills | Status: DC

## 2020-02-19 ENCOUNTER — Telehealth: Payer: Self-pay | Admitting: Family

## 2020-02-19 ENCOUNTER — Other Ambulatory Visit: Payer: Self-pay | Admitting: Family

## 2020-02-19 DIAGNOSIS — D509 Iron deficiency anemia, unspecified: Secondary | ICD-10-CM

## 2020-02-19 NOTE — Telephone Encounter (Signed)
err

## 2020-02-25 ENCOUNTER — Ambulatory Visit: Payer: 59

## 2020-02-25 ENCOUNTER — Other Ambulatory Visit: Payer: Self-pay

## 2020-02-25 DIAGNOSIS — E118 Type 2 diabetes mellitus with unspecified complications: Secondary | ICD-10-CM

## 2020-02-25 DIAGNOSIS — Z794 Long term (current) use of insulin: Secondary | ICD-10-CM

## 2020-02-25 NOTE — Progress Notes (Addendum)
Patient came in today for teaching on how to use her Freestyle [place] II. Patient was shown how to correctly apply sensor, that is replaced every 14 days. She was shown where on arm to place sensor as well. I demonstrated how to use reader device & check blood sugars. I explained how device worked as well as it's settings. I was able to set patient's settings for high & low alarms. Pt will be alerted if blood sugar goes under 70 mg/dL & 299 mg/dL when high.   Patient verbalized & demonstrated back to me understanding on how to use device. She was asked to please call office if she had any further questions or concerns.   Noted.  Rennie Plowman, NP

## 2020-03-17 ENCOUNTER — Encounter: Payer: Self-pay | Admitting: Family

## 2020-03-18 NOTE — Telephone Encounter (Signed)
I spoke with patient & advised her the Carma Leaven weren't here to advise me on this. I thought that maybe we could put her on nurse visit next week & Olegario Messier may could just supervise while patient changed. She said that her husband was nervous because she hasn't changed in awhile & didn't want anything to go wrong. I spoke with Quenten Raven, who used to work at [facility] & she suggested that she call there if established. They have all the proper tools & materials should anything go wrong or trach needed cleaning. Pt stated that she currently went to [facility] for f/u with anything in regards to trach. She stated that before that she used to see Dr. Willeen Cass who had actually referred her to [facility] She said that she would call their office to see if a nurse there may would supervise her. I asked her to please call back if she needed anything or a referral since it had been a few years since she was there.

## 2020-03-23 ENCOUNTER — Telehealth: Payer: Self-pay | Admitting: *Deleted

## 2020-03-23 DIAGNOSIS — D649 Anemia, unspecified: Secondary | ICD-10-CM

## 2020-03-23 NOTE — Telephone Encounter (Signed)
Please place future orders for lab appt.  

## 2020-03-25 ENCOUNTER — Other Ambulatory Visit: Payer: 59

## 2020-03-26 ENCOUNTER — Other Ambulatory Visit: Payer: Self-pay | Admitting: Family

## 2020-03-26 DIAGNOSIS — I10 Essential (primary) hypertension: Secondary | ICD-10-CM

## 2020-04-16 ENCOUNTER — Other Ambulatory Visit: Payer: Self-pay | Admitting: Family

## 2020-04-16 DIAGNOSIS — F909 Attention-deficit hyperactivity disorder, unspecified type: Secondary | ICD-10-CM

## 2020-04-16 NOTE — Telephone Encounter (Signed)
Call patient Carol [name]  should be a refill in the pharmacy dated for 5/1 of adderall  Please remind her that I sent 3 prescription at a time, and oftentimes the pharmacy will hold onto them.

## 2020-04-16 NOTE — Telephone Encounter (Signed)
Refill request for adderall, last seen 02-13-20, last filled 02-16-20.  Please advise.

## 2020-04-23 ENCOUNTER — Ambulatory Visit: Payer: 59 | Admitting: Family

## 2020-05-12 ENCOUNTER — Ambulatory Visit: Payer: 59 | Admitting: Family

## 2020-05-24 ENCOUNTER — Encounter: Payer: Self-pay | Admitting: Family

## 2020-05-24 ENCOUNTER — Other Ambulatory Visit: Payer: Self-pay

## 2020-05-24 ENCOUNTER — Ambulatory Visit (INDEPENDENT_AMBULATORY_CARE_PROVIDER_SITE_OTHER): Payer: No Typology Code available for payment source | Admitting: Family

## 2020-05-24 VITALS — BP 122/88 | HR 98 | Temp 98.4°F | Ht 65.24 in | Wt 185.4 lb

## 2020-05-24 DIAGNOSIS — E119 Type 2 diabetes mellitus without complications: Secondary | ICD-10-CM | POA: Diagnosis not present

## 2020-05-24 DIAGNOSIS — R5383 Other fatigue: Secondary | ICD-10-CM | POA: Diagnosis not present

## 2020-05-24 DIAGNOSIS — I10 Essential (primary) hypertension: Secondary | ICD-10-CM | POA: Diagnosis not present

## 2020-05-24 DIAGNOSIS — Z794 Long term (current) use of insulin: Secondary | ICD-10-CM

## 2020-05-24 DIAGNOSIS — R87615 Unsatisfactory cytologic smear of cervix: Secondary | ICD-10-CM

## 2020-05-24 NOTE — Assessment & Plan Note (Signed)
Pending A1c 

## 2020-05-24 NOTE — Assessment & Plan Note (Addendum)
Some improvement however patient I discussed at length various etiologies including her diet as she is pescartarian,and has not been eating fish or beans  the past couple weeks.  We are looking further at her iron stores, B12 and folate.  Also discussed no formal exercise and patient will start a walking program for this.  Patient will let me know if no improvement of symptoms.

## 2020-05-24 NOTE — Patient Instructions (Addendum)
Repeat pap smear in August/september.Please schedule  Start vitamin which includes iron and b12.   Let me know if fatigue doesn't improve.

## 2020-05-24 NOTE — Progress Notes (Signed)
Subjective:    Patient ID: Carol [name], female    DOB: [birth year], 39 y.o.   MRN: [id number]  CC: SRITHA CHAUNCEY is a 39 y.o. female who presents today for follow up.   HPI: Complains of 'low energy' for the past month, off and on.  Attributes to weight gain and from feeling busy at home. Had been busy with daughter. Sleeping well. Walks dogs, clean the house. No formal exercise.   Anemia- does not eat red meat. Eats fish however hasnt been eating fish nor beans lately.   Has been more tearful however this is improving. Feels zoloft dose appropriate. Feels refreshed after sleep.   Plans to start 17 day diet which follows a book in which is low carb and start walking exercise. It is NOT a fasting diet.   ADHD- feels well on adderall. No increased anxiety, cp, palpitations.   DMII- some excursions with snacking however seen numbers improve since diet changes, FBG 98, 130.  No hypoglycemic episodes  HTN- at home 120/80.  Compliant with medication   Mammogram up-to-date   HISTORY:  Past Medical History:  Diagnosis Date  . Attention deficit disorder (ADD)   . Diabetes mellitus without complication ([place]) 4098  . Hypertension   . Subglottic stenosis    Trach collar   Past Surgical History:  Procedure Laterality Date  . BREAST BIOPSY Right    benign  . BREAST BIOPSY Left 10/2017   fibroadeoma  . BREAST BIOPSY Left 10/2017   fibroadenoma  . TRACHEOSTOMY  2010   Family History  Problem Relation Age of Onset  . Diabetes Father   . Hypertension Father   . Hypertension Brother   . Breast cancer Neg Hx   . Thyroid cancer Neg Hx     Allergies: Lac bovis Current Outpatient Medications on File Prior to Visit  Medication Sig Dispense Refill  . amLODipine (NORVASC) 10 MG tablet TAKE 1 TABLET BY MOUTH DAILY 90 tablet 2  . amoxicillin (AMOXIL) 500 MG capsule Take 500 mg by mouth 3 (three) times daily.    Marland Kitchen amphetamine-dextroamphetamine (ADDERALL XR) 20 MG 24 hr  capsule Take 1 capsule (20 mg total) by mouth daily. 30 capsule 0  . Continuous Blood Gluc Receiver (FREESTYLE LIBRE 2 READER) DEVI 1 Device by Does not apply route daily. 1 each 0  . Continuous Blood Gluc [name] (FREESTYLE LIBRE 2 [name]) MISC 2 Devices by Does not apply route every 14 (fourteen) days. 2 each 4  . etonogestrel (NEXPLANON) 68 MG IMPL implant 1 each by Subdermal route once.    Marland Kitchen glucose blood test strip Use as instructed up to 2 times per day 100 each 12  . HYDROcodone-acetaminophen (NORCO) 10-325 MG tablet Take 1 tablet by mouth 4 (four) times daily as needed.    . Insulin Glargine (BASAGLAR KWIKPEN) 100 UNIT/ML SOPN INJECT 0.5 MLS (50 UNITS   TOTAL) INTO THE SKIN DAILY AT 10PM 45 mL 1  . liraglutide (VICTOZA) 18 MG/3ML SOPN Inject 0.2 mLs (1.2 mg total) into the skin daily. Start 0.6mg  [place] QD x 1 week, then 1.2mg  [place] qd. 3 pen 2  . losartan (COZAAR) 100 MG tablet Take 1 tablet (100 mg total) by mouth daily. 90 tablet 3  . losartan (COZAAR) 50 MG tablet Take 50 mg by mouth daily.    . metFORMIN (GLUCOPHAGE XR) 500 MG 24 hr tablet Start 500mg  PO qpm. 90 tablet 1  . rosuvastatin (CRESTOR) 40 MG tablet Take 1 tablet (  40 mg total) by mouth daily. 90 tablet 3  . sertraline (ZOLOFT) 100 MG tablet TAKE 1 TABLET BY MOUTH EVERYDAY AT BEDTIME 90 tablet 1   No current facility-administered medications on file prior to visit.    Social History   Tobacco Use  . Smoking status: Former Smoker    Packs/day: 0.25    Years: 3.00    Pack years: 0.75    Types: Cigarettes    Quit date: 02/17/2002    Years since quitting: 18.2  . Smokeless tobacco: Never Used  Substance Use Topics  . Alcohol use: Yes    Alcohol/week: 0.0 standard drinks    Comment: Rare - 2  year  . Drug use: No    Review of Systems  Constitutional: Positive for fatigue. Negative for chills and fever.  HENT: Negative for trouble swallowing.   Respiratory: Negative for cough.   Cardiovascular: Negative for chest pain and  palpitations.  Gastrointestinal: Negative for nausea and vomiting.      Objective:    BP 122/88 (BP Location: Left Arm, Patient Position: Sitting)   Pulse 98   Temp 98.4 F (36.9 C)   Ht 5' 5.24" (1.657 m)   Wt 185 lb 6.4 oz (84.1 kg)   SpO2 98%   BMI 30.63 kg/m  BP Readings from Last 3 Encounters:  05/24/20 122/88  02/13/20 110/82  08/29/19 123/80   Wt Readings from Last 3 Encounters:  05/24/20 185 lb 6.4 oz (84.1 kg)  02/13/20 184 lb 6.4 oz (83.6 kg)  08/29/19 179 lb 1 oz (81.2 kg)    Physical Exam Vitals reviewed.  Constitutional:      Appearance: She is well-developed.  Eyes:     Conjunctiva/sclera: Conjunctivae normal.  Neck:     Thyroid: No thyroid mass or thyromegaly.  Cardiovascular:     Rate and Rhythm: Normal rate and regular rhythm.     Pulses: Normal pulses.     Heart sounds: Normal heart sounds.  Pulmonary:     Effort: Pulmonary effort is normal.     Breath sounds: Normal breath sounds. No wheezing, rhonchi or rales.  Lymphadenopathy:     Head:     Right side of head: No submental, submandibular, tonsillar, preauricular, posterior auricular or occipital adenopathy.     Left side of head: No submental, submandibular, tonsillar, preauricular, posterior auricular or occipital adenopathy.     Cervical: No cervical adenopathy.  Skin:    General: Skin is warm and dry.  Neurological:     Mental Status: She is alert.  Psychiatric:        Speech: Speech normal.        Behavior: Behavior normal.        Thought Content: Thought content normal.        Assessment & Plan:   Problem List Items Addressed This Visit      Cardiovascular and Mediastinum   Benign essential HTN    Well-controlled, continue current regimen      Relevant Medications   losartan (COZAAR) 50 MG tablet     Endocrine   DM type 2 (diabetes mellitus, type 2) (HCC)    .  Pending A1c      Relevant Medications   losartan (COZAAR) 50 MG tablet     Other   Abnormal Pap smear  of cervix    Reviewed Pap smear with patient again today, transformation zone was absent, she will return in 2 to 3 months to repeat Pap smear.  Fatigue - Primary    Some improvement however patient I discussed at length various etiologies including her diet as she is pescartarian,and has not been eating fish or beans  the past couple weeks.  We are looking further at her iron stores, B12 and folate.  Also discussed no formal exercise and patient will start a walking program for this.  Patient will let me know if no improvement of symptoms.       Relevant Orders   B12 and Folate Panel   CBC with Differential/Platelet   Hemoglobin A1c   IBC + Ferritin       I am having Joelene Millin. Crotty maintain her glucose blood, metFORMIN, Basaglar KwikPen, losartan, Nexplanon, sertraline, FreeStyle Libre 2 Reader, [place] 2 [name], amphetamine-dextroamphetamine, rosuvastatin, liraglutide, amLODipine, losartan, HYDROcodone-acetaminophen, and amoxicillin.   No orders of the defined types were placed in this encounter.   Return precautions given.   Risks, benefits, and alternatives of the medications and treatment plan prescribed today were discussed, and patient expressed understanding.   Education regarding symptom management and diagnosis given to patient on AVS.  Continue to follow with Allegra Grana, FNP for routine health maintenance.   Jacolyn Reedy and I agreed with plan.   Rennie Plowman, FNP

## 2020-05-24 NOTE — Assessment & Plan Note (Signed)
Reviewed Pap smear with patient again today, transformation zone was absent, she will return in 2 to 3 months to repeat Pap smear.

## 2020-05-24 NOTE — Assessment & Plan Note (Signed)
Well-controlled, continue current regimen 

## 2020-06-07 ENCOUNTER — Telehealth: Payer: Self-pay | Admitting: Family

## 2020-06-07 NOTE — Telephone Encounter (Signed)
Rejection Reason - Patient did not respond" [facility] said about 21 hours ago  Pt was called in May vm full. I sent pt a Mychart msg.

## 2020-06-18 ENCOUNTER — Other Ambulatory Visit: Payer: Self-pay | Admitting: Family

## 2020-06-18 DIAGNOSIS — F909 Attention-deficit hyperactivity disorder, unspecified type: Secondary | ICD-10-CM

## 2020-06-18 MED ORDER — AMPHETAMINE-DEXTROAMPHET ER 20 MG PO CP24: 20.0000 mg | ORAL_CAPSULE | Freq: Every day | ORAL | 0 refills | Status: DC

## 2020-06-18 NOTE — Telephone Encounter (Signed)
Refill request for adderall, last seen 05-24-20, last filled 02-16-20.  Please advise.

## 2020-06-22 ENCOUNTER — Other Ambulatory Visit: Payer: Self-pay | Admitting: Family

## 2020-06-22 DIAGNOSIS — E1100 Type 2 diabetes mellitus with hyperosmolarity without nonketotic hyperglycemic-hyperosmolar coma (NKHHC): Secondary | ICD-10-CM

## 2020-06-26 ENCOUNTER — Other Ambulatory Visit: Payer: Self-pay | Admitting: Family

## 2020-06-26 DIAGNOSIS — F32A Depression, unspecified: Secondary | ICD-10-CM

## 2020-07-26 ENCOUNTER — Ambulatory Visit: Payer: No Typology Code available for payment source | Admitting: Family

## 2020-07-26 DIAGNOSIS — Z0289 Encounter for other administrative examinations: Secondary | ICD-10-CM

## 2020-07-26 NOTE — Progress Notes (Deleted)
Subjective:    Patient ID: CATHYE KREITER, female    DOB: [birth year], 39 y.o.   MRN: [id number]  CC: GEONNA LOCKYER is a 39 y.o. female who presents today for follow up.   HPI: HPI Pap smear- ? appt with Dr Elesa Massed Fatigue- Due labs for b12 HISTORY:  Past Medical History:  Diagnosis Date  . Attention deficit disorder (ADD)   . Diabetes mellitus without complication (HCC) 2009  . Hypertension   . Subglottic stenosis    Trach collar   Past Surgical History:  Procedure Laterality Date  . BREAST BIOPSY Right    benign  . BREAST BIOPSY Left 10/2017   fibroadeoma  . BREAST BIOPSY Left 10/2017   fibroadenoma  . TRACHEOSTOMY  2010   Family History  Problem Relation Age of Onset  . Diabetes Father   . Hypertension Father   . Hypertension Brother   . Breast cancer Neg Hx   . Thyroid cancer Neg Hx     Allergies: Lac bovis Current Outpatient Medications on File Prior to Visit  Medication Sig Dispense Refill  . amLODipine (NORVASC) 10 MG tablet TAKE 1 TABLET BY MOUTH DAILY 90 tablet 2  . amoxicillin (AMOXIL) 500 MG capsule Take 500 mg by mouth 3 (three) times daily.    Marland Kitchen amphetamine-dextroamphetamine (ADDERALL XR) 20 MG 24 hr capsule Take 1 capsule (20 mg total) by mouth daily. 30 capsule 0  . Continuous Blood Gluc Receiver (FREESTYLE LIBRE 2 READER) DEVI 1 Device by Does not apply route daily. 1 each 0  . Continuous Blood Gluc Sensor (FREESTYLE LIBRE 2 SENSOR) MISC 2 Devices by Does not apply route every 14 (fourteen) days. 2 each 4  . etonogestrel (NEXPLANON) 68 MG IMPL implant 1 each by Subdermal route once.    Marland Kitchen glucose blood test strip Use as instructed up to 2 times per day 100 each 12  . HYDROcodone-acetaminophen (NORCO) 10-325 MG tablet Take 1 tablet by mouth 4 (four) times daily as needed.    . Insulin Glargine (BASAGLAR KWIKPEN) 100 UNIT/ML INJECT 0.5 MLS (50 UNITS   TOTAL) INTO THE SKIN DAILY AT 10PM 45 mL 1  . liraglutide (VICTOZA) 18 MG/3ML SOPN Inject 0.2 mLs  (1.2 mg total) into the skin daily. Start 0.6mg  [place] QD x 1 week, then 1.2mg  [place] qd. 3 pen 2  . losartan (COZAAR) 100 MG tablet Take 1 tablet (100 mg total) by mouth daily. 90 tablet 3  . losartan (COZAAR) 50 MG tablet Take 50 mg by mouth daily.    . metFORMIN (GLUCOPHAGE XR) 500 MG 24 hr tablet Start 500mg  PO qpm. 90 tablet 1  . rosuvastatin (CRESTOR) 40 MG tablet Take 1 tablet (40 mg total) by mouth daily. 90 tablet 3  . sertraline (ZOLOFT) 100 MG tablet TAKE 1 TABLET BY MOUTH EVERYDAY AT BEDTIME 90 tablet 1   No current facility-administered medications on file prior to visit.    Social History   Tobacco Use  . Smoking status: Former Smoker    Packs/day: 0.25    Years: 3.00    Pack years: 0.75    Types: Cigarettes    Quit date: 02/17/2002    Years since quitting: 18.4  . Smokeless tobacco: Never Used  Substance Use Topics  . Alcohol use: Yes    Alcohol/week: 0.0 standard drinks    Comment: Rare - 2  year  . Drug use: No    Review of Systems    Objective:  There were no vitals taken for this visit. BP Readings from Last 3 Encounters:  05/24/20 122/88  02/13/20 110/82  08/29/19 123/80   Wt Readings from Last 3 Encounters:  05/24/20 185 lb 6.4 oz (84.1 kg)  02/13/20 184 lb 6.4 oz (83.6 kg)  08/29/19 179 lb 1 oz (81.2 kg)    Physical Exam     Assessment & Plan:   Problem List Items Addressed This Visit    None       I am having Joelene Millin. Colantonio maintain her glucose blood, metFORMIN, losartan, Nexplanon, FreeStyle Libre 2 Reader, FreeStyle Libre 2 Sensor, rosuvastatin, liraglutide, amLODipine, losartan, HYDROcodone-acetaminophen, amoxicillin, amphetamine-dextroamphetamine, Basaglar KwikPen, and sertraline.   No orders of the defined types were placed in this encounter.   Return precautions given.   Risks, benefits, and alternatives of the medications and treatment plan prescribed today were discussed, and patient expressed understanding.   Education  regarding symptom management and diagnosis given to patient on AVS.  Continue to follow with Allegra Grana, FNP for routine health maintenance.   Jacolyn Reedy and I agreed with plan.   Rennie Plowman, FNP

## 2020-08-29 ENCOUNTER — Encounter: Payer: Self-pay | Admitting: Family

## 2020-09-07 ENCOUNTER — Ambulatory Visit: Payer: No Typology Code available for payment source | Admitting: Family

## 2020-09-07 ENCOUNTER — Other Ambulatory Visit: Payer: Self-pay

## 2020-09-07 ENCOUNTER — Other Ambulatory Visit: Payer: Self-pay | Admitting: Family

## 2020-09-07 ENCOUNTER — Encounter: Payer: Self-pay | Admitting: Family

## 2020-09-07 VITALS — BP 122/84 | HR 109 | Temp 98.5°F | Ht 64.0 in | Wt 185.0 lb

## 2020-09-07 DIAGNOSIS — F909 Attention-deficit hyperactivity disorder, unspecified type: Secondary | ICD-10-CM | POA: Diagnosis not present

## 2020-09-07 DIAGNOSIS — F419 Anxiety disorder, unspecified: Secondary | ICD-10-CM | POA: Diagnosis not present

## 2020-09-07 DIAGNOSIS — E118 Type 2 diabetes mellitus with unspecified complications: Secondary | ICD-10-CM | POA: Diagnosis not present

## 2020-09-07 DIAGNOSIS — F329 Major depressive disorder, single episode, unspecified: Secondary | ICD-10-CM

## 2020-09-07 DIAGNOSIS — Z794 Long term (current) use of insulin: Secondary | ICD-10-CM

## 2020-09-07 DIAGNOSIS — E119 Type 2 diabetes mellitus without complications: Secondary | ICD-10-CM

## 2020-09-07 DIAGNOSIS — D508 Other iron deficiency anemias: Secondary | ICD-10-CM

## 2020-09-07 DIAGNOSIS — D649 Anemia, unspecified: Secondary | ICD-10-CM | POA: Insufficient documentation

## 2020-09-07 DIAGNOSIS — F32A Depression, unspecified: Secondary | ICD-10-CM

## 2020-09-07 MED ORDER — LIRAGLUTIDE 18 MG/3ML ~~LOC~~ SOPN: 1.8000 mg | PEN_INJECTOR | Freq: Every day | SUBCUTANEOUS | 1 refills | Status: DC

## 2020-09-07 MED ORDER — FERROUS SULFATE 325 (65 FE) MG PO TBEC: 325.0000 mg | DELAYED_RELEASE_TABLET | Freq: Two times a day (BID) | ORAL | 2 refills | Status: DC

## 2020-09-07 MED ORDER — METFORMIN HCL ER 500 MG PO TB24: 500.0000 mg | ORAL_TABLET | Freq: Every evening | ORAL | 1 refills | Status: DC

## 2020-09-07 NOTE — Assessment & Plan Note (Signed)
Suspect IDA from lack of diet sources. Occult card negative for blood. She will start ferrous sulfate 65mg  BID ( if tolerated) for 3 months. Pending labs today.

## 2020-09-07 NOTE — Progress Notes (Signed)
Subjective:    Patient ID: Carol [name], female    DOB: April 05, 1981, 39 y.o.   MRN: [id number]  CC: WENDOLYN RASO is a 39 y.o. female who presents today for follow up.   HPI: Complains of worsening depression and anxiety for one year however didn't want to bring it up at last visit as afraid of stigma.  In 2015 to 2018 had an ineffective manager during that time, who is no longer there, she feels that this causes her to feel panicked around management which has lasted to this day, even with better management.    Happy with marriage, daughter. Sleeping a lot and no motivation. Trouble falling and staying asleep.   Compliant with zoloft 50mg  which has been quite helpful for anxiety.  Decreased appetite during the day and then eats at night.   ADHD- compliant with adderall 20mg   And feels adequate dose. No palpitations, CP.   Anemia- eats only fish diet. . No vaginal bleeding. No sob.  Not taking iron.   DM- compliant with 50 units lantus, victoza 1.2mg . FBG in 200s.     HISTORY:  Past Medical History:  Diagnosis Date   Attention deficit disorder (ADD)    Diabetes mellitus without complication (HCC) 2009   Hypertension    Subglottic stenosis    Trach collar   Past Surgical History:  Procedure Laterality Date   BREAST BIOPSY Right    benign   BREAST BIOPSY Left 10/2017   fibroadeoma   BREAST BIOPSY Left 10/2017   fibroadenoma   TRACHEOSTOMY  2010   Family History  Problem Relation Age of Onset   Diabetes Father    Hypertension Father    Hypertension Brother    Breast cancer Neg Hx    Thyroid cancer Neg Hx     Allergies: Lac bovis Current Outpatient Medications on File Prior to Visit  Medication Sig Dispense Refill   amLODipine (NORVASC) 10 MG tablet TAKE 1 TABLET BY MOUTH DAILY 90 tablet 2   amphetamine-dextroamphetamine (ADDERALL XR) 20 MG 24 hr capsule Take 1 capsule (20 mg total) by mouth daily. 30 capsule 0   Continuous Blood Gluc  Receiver (FREESTYLE LIBRE 2 READER) DEVI 1 Device by Does not apply route daily. 1 each 0   Continuous Blood Gluc Sensor (FREESTYLE LIBRE 2 SENSOR) MISC 2 Devices by Does not apply route every 14 (fourteen) days. 2 each 4   etonogestrel (NEXPLANON) 68 MG IMPL implant 1 each by Subdermal route once.     glucose blood test strip Use as instructed up to 2 times per day 100 each 12   Insulin Glargine (BASAGLAR KWIKPEN) 100 UNIT/ML INJECT 0.5 MLS (50 UNITS   TOTAL) INTO THE SKIN DAILY AT 10PM 45 mL 1   losartan (COZAAR) 100 MG tablet Take 1 tablet (100 mg total) by mouth daily. 90 tablet 3   losartan (COZAAR) 50 MG tablet Take 50 mg by mouth daily.     rosuvastatin (CRESTOR) 40 MG tablet Take 1 tablet (40 mg total) by mouth daily. 90 tablet 3   sertraline (ZOLOFT) 100 MG tablet TAKE 1 TABLET BY MOUTH EVERYDAY AT BEDTIME 90 tablet 1   No current facility-administered medications on file prior to visit.    Social History   Tobacco Use   Smoking status: Former Smoker    Packs/day: 0.25    Years: 3.00    Pack years: 0.75    Types: Cigarettes    Quit date: 02/17/2002  Years since quitting: 18.5   Smokeless tobacco: Never Used  Substance Use Topics   Alcohol use: Yes    Alcohol/week: 0.0 standard drinks    Comment: Rare - 2  year   Drug use: No    Review of Systems  Constitutional: Negative for chills and fever.  Respiratory: Negative for cough.   Cardiovascular: Negative for chest pain and palpitations.  Gastrointestinal: Negative for nausea and vomiting.  Psychiatric/Behavioral: Positive for sleep disturbance. Negative for suicidal ideas. The patient is nervous/anxious.       Objective:    BP 122/84    Pulse (!) 109    Temp 98.5 F (36.9 C)    Ht 5\' 4"  (1.626 m)    Wt 185 lb (83.9 kg)    SpO2 97%    BMI 31.76 kg/m  BP Readings from Last 3 Encounters:  09/07/20 122/84  05/24/20 122/88  02/13/20 110/82   Wt Readings from Last 3 Encounters:  09/07/20 185 lb (83.9 kg)   05/24/20 185 lb 6.4 oz (84.1 kg)  02/13/20 184 lb 6.4 oz (83.6 kg)    Physical Exam Vitals reviewed.  Constitutional:      Appearance: She is well-developed.  Eyes:     Conjunctiva/sclera: Conjunctivae normal.  Cardiovascular:     Rate and Rhythm: Normal rate and regular rhythm.     Pulses: Normal pulses.     Heart sounds: Normal heart sounds.  Pulmonary:     Effort: Pulmonary effort is normal.     Breath sounds: Normal breath sounds. No wheezing, rhonchi or rales.  Skin:    General: Skin is warm and dry.  Neurological:     Mental Status: She is alert.  Psychiatric:        Speech: Speech normal.        Behavior: Behavior normal.        Thought Content: Thought content normal.        Assessment & Plan:   Problem List Items Addressed This Visit      Endocrine   DM type 2 (diabetes mellitus, type 2) (HCC)    Uncontrolled.Would like to optimize non insulin therapies to aid in weight loss.  Advised to Start metformin 500mg  and Increase victoza to 1.6mg . Plan to decrease insulin as we move along based on control      Relevant Medications   metFORMIN (GLUCOPHAGE XR) 500 MG 24 hr tablet   liraglutide (VICTOZA) 18 MG/3ML SOPN   ferrous sulfate 325 (65 FE) MG EC tablet     Other   ADHD (attention deficit hyperactivity disorder)    Controlled. Continue regimen. Pending urine screen      Relevant Orders   Pain Management Screening Profile (10S)   Anemia    Suspect IDA from lack of diet sources. Occult card negative for blood. She will start ferrous sulfate 65mg  BID ( if tolerated) for 3 months. Pending labs today.       Relevant Medications   ferrous sulfate 325 (65 FE) MG EC tablet   Other Relevant Orders   CBC with Differential/Platelet   B12 and Folate Panel   IBC + Ferritin   Anxiety and depression - Primary    Uncontrolled. Increase zoloft. Close follow up.        Other Visit Diagnoses    Type 2 diabetes mellitus with complication, with long-term current  use of insulin (HCC)       Relevant Medications   metFORMIN (GLUCOPHAGE XR) 500 MG 24 hr tablet  liraglutide (VICTOZA) 18 MG/3ML SOPN   ferrous sulfate 325 (65 FE) MG EC tablet   Other Relevant Orders   Lipid panel   Microalbumin / creatinine urine ratio   Hemoglobin A1c       I have discontinued Joelene Millin. Kusek's metFORMIN, HYDROcodone-acetaminophen, and amoxicillin. I have also changed her liraglutide. Additionally, I am having her start on metFORMIN and ferrous sulfate. Lastly, I am having her maintain her glucose blood, losartan, Nexplanon, FreeStyle Libre 2 Reader, FreeStyle Libre 2 Sensor, rosuvastatin, amLODipine, losartan, amphetamine-dextroamphetamine, Basaglar KwikPen, and sertraline.   Meds ordered this encounter  Medications   metFORMIN (GLUCOPHAGE XR) 500 MG 24 hr tablet    Sig: Take 1 tablet (500 mg total) by mouth every evening.    Dispense:  90 tablet    Refill:  1    Order Specific Question:   Supervising Provider    Answer:   Duncan Dull L [[id number]]   liraglutide (VICTOZA) 18 MG/3ML SOPN    Sig: Inject 1.8 mg into the skin daily.    Dispense:  3 mL    Refill:  1    Order Specific Question:   Supervising Provider    Answer:   Duncan Dull L [[id number]]   ferrous sulfate 325 (65 FE) MG EC tablet    Sig: Take 1 tablet (325 mg total) by mouth 2 (two) times daily with a meal.    Dispense:  60 tablet    Refill:  2    Order Specific Question:   Supervising Provider    Answer:   Sherlene Shams [[id number]]    Return precautions given.   Risks, benefits, and alternatives of the medications and treatment plan prescribed today were discussed, and patient expressed understanding.   Education regarding symptom management and diagnosis given to patient on AVS.  Continue to follow with Allegra Grana, FNP for routine health maintenance.   Jacolyn Reedy and I agreed with plan.   Rennie Plowman, FNP

## 2020-09-07 NOTE — Assessment & Plan Note (Signed)
Controlled. Continue regimen. Pending urine screen

## 2020-09-07 NOTE — Patient Instructions (Addendum)
Increase zoloft to 100mg .  Start metformin 500mg   Increase victoza to 1.6mg  Start iron for 3 months; watch out for constipation, start colace if needed  Pap next time  Stay safe!

## 2020-09-07 NOTE — Assessment & Plan Note (Signed)
Uncontrolled.Would like to optimize non insulin therapies to aid in weight loss.  Advised to Start metformin 500mg  and Increase victoza to 1.6mg . Plan to decrease insulin as we move along based on control

## 2020-09-07 NOTE — Assessment & Plan Note (Addendum)
Uncontrolled. Increase zoloft. Close follow up.

## 2020-09-27 ENCOUNTER — Other Ambulatory Visit (INDEPENDENT_AMBULATORY_CARE_PROVIDER_SITE_OTHER): Payer: No Typology Code available for payment source

## 2020-09-27 ENCOUNTER — Other Ambulatory Visit: Payer: Self-pay

## 2020-09-27 ENCOUNTER — Ambulatory Visit: Payer: No Typology Code available for payment source | Admitting: Family

## 2020-09-27 DIAGNOSIS — Z794 Long term (current) use of insulin: Secondary | ICD-10-CM

## 2020-09-27 DIAGNOSIS — E118 Type 2 diabetes mellitus with unspecified complications: Secondary | ICD-10-CM | POA: Diagnosis not present

## 2020-09-27 DIAGNOSIS — D508 Other iron deficiency anemias: Secondary | ICD-10-CM

## 2020-09-27 DIAGNOSIS — F909 Attention-deficit hyperactivity disorder, unspecified type: Secondary | ICD-10-CM

## 2020-09-27 LAB — IBC + FERRITIN
Ferritin: 22 ng/mL (ref 10.0–291.0)
Iron: 47 ug/dL (ref 42–145)
Saturation Ratios: 12.8 % — ABNORMAL LOW (ref 20.0–50.0)
Transferrin: 262 mg/dL (ref 212.0–360.0)

## 2020-09-27 LAB — MICROALBUMIN / CREATININE URINE RATIO
Creatinine,U: 180.9 mg/dL
Microalb Creat Ratio: 0.5 mg/g (ref 0.0–30.0)
Microalb, Ur: 0.8 mg/dL (ref 0.0–1.9)

## 2020-09-27 LAB — CBC WITH DIFFERENTIAL/PLATELET
Basophils Absolute: 0.1 10*3/uL (ref 0.0–0.1)
Basophils Relative: 1 % (ref 0.0–3.0)
Eosinophils Absolute: 0.1 10*3/uL (ref 0.0–0.7)
Eosinophils Relative: 1.4 % (ref 0.0–5.0)
HCT: 35.4 % — ABNORMAL LOW (ref 36.0–46.0)
Hemoglobin: 11.6 g/dL — ABNORMAL LOW (ref 12.0–15.0)
Lymphocytes Relative: 20.8 % (ref 12.0–46.0)
Lymphs Abs: 1.4 10*3/uL (ref 0.7–4.0)
MCHC: 32.7 g/dL (ref 30.0–36.0)
MCV: 81 fl (ref 78.0–100.0)
Monocytes Absolute: 0.5 10*3/uL (ref 0.1–1.0)
Monocytes Relative: 6.9 % (ref 3.0–12.0)
Neutro Abs: 4.6 10*3/uL (ref 1.4–7.7)
Neutrophils Relative %: 69.9 % (ref 43.0–77.0)
Platelets: 311 10*3/uL (ref 150.0–400.0)
RBC: 4.38 Mil/uL (ref 3.87–5.11)
RDW: 13.5 % (ref 11.5–15.5)
WBC: 6.6 10*3/uL (ref 4.0–10.5)

## 2020-09-27 LAB — LIPID PANEL
Cholesterol: 140 mg/dL (ref 0–200)
HDL: 48.2 mg/dL (ref >39.00)
LDL Cholesterol: 80 mg/dL (ref 0–99)
NonHDL: 91.64
Total CHOL/HDL Ratio: 3
Triglycerides: 60 mg/dL (ref 0.0–149.0)
VLDL: 12 mg/dL (ref 0.0–40.0)

## 2020-09-27 LAB — B12 AND FOLATE PANEL
Folate: 17.8 ng/mL (ref >5.9)
Vitamin B-12: 527 pg/mL (ref 211–911)

## 2020-09-27 LAB — HEMOGLOBIN A1C: Hgb A1c MFr Bld: 10 % — ABNORMAL HIGH (ref 4.6–6.5)

## 2020-09-29 LAB — PMP SCREEN PROFILE (10S), URINE
Amphetamine Scrn, Ur: POSITIVE ng/mL — AB
BARBITURATE SCREEN URINE: NEGATIVE ng/mL
BENZODIAZEPINE SCREEN, URINE: NEGATIVE ng/mL
CANNABINOIDS UR QL SCN: NEGATIVE ng/mL
Cocaine (Metab) Scrn, Ur: NEGATIVE ng/mL
Creatinine(Crt), U: 196.7 mg/dL (ref 20.0–300.0)
Methadone Screen, Urine: NEGATIVE ng/mL
OXYCODONE+OXYMORPHONE UR QL SCN: NEGATIVE ng/mL
Opiate Scrn, Ur: NEGATIVE ng/mL
Ph of Urine: 5.6 (ref 4.5–8.9)
Phencyclidine Qn, Ur: NEGATIVE ng/mL
Propoxyphene Scrn, Ur: NEGATIVE ng/mL

## 2020-10-14 ENCOUNTER — Other Ambulatory Visit: Payer: Self-pay

## 2020-10-14 DIAGNOSIS — F909 Attention-deficit hyperactivity disorder, unspecified type: Secondary | ICD-10-CM

## 2020-10-15 ENCOUNTER — Other Ambulatory Visit: Payer: Self-pay | Admitting: Family

## 2020-10-15 DIAGNOSIS — F909 Attention-deficit hyperactivity disorder, unspecified type: Secondary | ICD-10-CM

## 2020-10-15 MED ORDER — AMPHETAMINE-DEXTROAMPHET ER 20 MG PO CP24: 20.0000 mg | ORAL_CAPSULE | ORAL | 0 refills | Status: DC

## 2020-10-15 MED ORDER — AMPHETAMINE-DEXTROAMPHET ER 20 MG PO CP24: 20.0000 mg | ORAL_CAPSULE | Freq: Every day | ORAL | 0 refills | Status: DC

## 2020-10-15 NOTE — Progress Notes (Signed)
I looked up patient on [place] Controlled Substances Reporting System PMP AWARE and saw no activity that raised concern of inappropriate use.   

## 2020-11-16 ENCOUNTER — Encounter: Payer: Self-pay | Admitting: Emergency Medicine

## 2020-11-16 ENCOUNTER — Other Ambulatory Visit: Payer: Self-pay

## 2020-11-16 ENCOUNTER — Emergency Department
Admission: EM | Admit: 2020-11-16 | Discharge: 2020-11-16 | Disposition: A | Discharge status: Home / Self Care | Payer: No Typology Code available for payment source | Attending: Emergency Medicine | Admitting: Emergency Medicine

## 2020-11-16 DIAGNOSIS — Y33XXXA Other specified events, undetermined intent, initial encounter: Secondary | ICD-10-CM | POA: Diagnosis not present

## 2020-11-16 DIAGNOSIS — Z79899 Other long term (current) drug therapy: Secondary | ICD-10-CM | POA: Insufficient documentation

## 2020-11-16 DIAGNOSIS — Z794 Long term (current) use of insulin: Secondary | ICD-10-CM | POA: Diagnosis not present

## 2020-11-16 DIAGNOSIS — Z87891 Personal history of nicotine dependence: Secondary | ICD-10-CM | POA: Diagnosis not present

## 2020-11-16 DIAGNOSIS — J9509 Other tracheostomy complication: Secondary | ICD-10-CM | POA: Insufficient documentation

## 2020-11-16 DIAGNOSIS — I10 Essential (primary) hypertension: Secondary | ICD-10-CM | POA: Diagnosis not present

## 2020-11-16 DIAGNOSIS — J9500 Unspecified tracheostomy complication: Secondary | ICD-10-CM

## 2020-11-16 DIAGNOSIS — E119 Type 2 diabetes mellitus without complications: Secondary | ICD-10-CM | POA: Diagnosis not present

## 2020-11-16 NOTE — ED Triage Notes (Signed)
Pt to ED from home c/o trach tube being broken and needing replaced. Patient unsure of where it's broken but worried about choking.  Pt denies pain with it, denies injury.  Pt holding trach in place at this time.  Pt anxious in triage, chest rise even and unlabored.

## 2020-11-16 NOTE — ED Provider Notes (Signed)
[facility] Emergency Department Provider Note  ____________________________________________  Time seen: Approximately 1:17 AM  I have reviewed the triage vital signs and the nursing notes.   HISTORY  Chief Complaint Tracheostomy Tube Change    HPI Carol [name] is a 39 y.o. female with a history of diabetes hypertension and long-term tracheostomy due to subglottic stenosis who comes the ED complaining that her trach tube attachment to the collar became broken causing the cannula to be loose.   She denies any acute symptoms such as chest pain shortness of breath fevers chills or productive cough, otherwise feels normal.  She did not have any replacement hardware at home.     Past Medical History:  Diagnosis Date  . Attention deficit disorder (ADD)   . Diabetes mellitus without complication (HCC) 2009  . Hypertension   . Subglottic stenosis    Trach collar     Patient Active Problem List   Diagnosis Date Noted  . Anemia 09/07/2020  . Fatigue 05/24/2020  . Abnormal Pap smear of cervix 02/13/2020  . Obesity 02/13/2020  . Screening for cervical cancer 07/14/2019  . Breast mass, right 06/30/2019  . Routine physical examination 06/30/2019  . Anxiety and depression 08/02/2018  . Skin lesion 03/11/2018  . Mass of upper inner quadrant of left breast 11/16/2017  . HLD (hyperlipidemia) 09/18/2017  . Breast lump in upper inner quadrant 02/27/2017  . Fluttering sensation of heart 02/27/2017  . ADHD (attention deficit hyperactivity disorder) 04/09/2016  . Subglottic stenosis 04/03/2016  . Benign essential HTN 04/03/2016  . DM type 2 (diabetes mellitus, type 2) (HCC) 04/03/2016  . Tracheostomy present (HCC) 08/26/2012     Past Surgical History:  Procedure Laterality Date  . BREAST BIOPSY Right    benign  . BREAST BIOPSY Left 10/2017   fibroadeoma  . BREAST BIOPSY Left 10/2017   fibroadenoma  . TRACHEOSTOMY  2010     Prior to Admission  medications   Medication Sig Start Date End Date Taking? Authorizing Provider  amLODipine (NORVASC) 10 MG tablet TAKE 1 TABLET BY MOUTH DAILY 03/26/20   Allegra Grana, FNP  amphetamine-dextroamphetamine (ADDERALL XR) 20 MG 24 hr capsule Take 1 capsule (20 mg total) by mouth daily. 10/15/20   Allegra Grana, FNP  amphetamine-dextroamphetamine (ADDERALL XR) 20 MG 24 hr capsule Take 1 capsule (20 mg total) by mouth every morning. 10/15/20   Allegra Grana, FNP  amphetamine-dextroamphetamine (ADDERALL XR) 20 MG 24 hr capsule Take 1 capsule (20 mg total) by mouth every morning. 10/15/20   Allegra Grana, FNP  Continuous Blood Gluc Receiver (FREESTYLE LIBRE 2 READER) DEVI 1 Device by Does not apply route daily. 02/13/20   Allegra Grana, FNP  Continuous Blood Gluc Sensor (FREESTYLE LIBRE 2 SENSOR) MISC 2 Devices by Does not apply route every 14 (fourteen) days. 02/13/20   Allegra Grana, FNP  etonogestrel (NEXPLANON) 68 MG IMPL implant 1 each by Subdermal route once.    [provider]  ferrous sulfate 325 (65 FE) MG EC tablet TAKE 1 TABLET TWICE DAILY  WITH MEALS. 09/08/20   Allegra Grana, FNP  glucose blood test strip Use as instructed up to 2 times per day 12/25/16   Tommie Sams, DO  Insulin Glargine (BASAGLAR KWIKPEN) 100 UNIT/ML INJECT 0.5 MLS (50 UNITS   TOTAL) INTO THE SKIN DAILY AT 10PM 06/22/20   Allegra Grana, FNP  liraglutide (VICTOZA) 18 MG/3ML SOPN Inject 1.8 mg into the skin  daily. 09/07/20   Allegra Grana, FNP  losartan (COZAAR) 100 MG tablet Take 1 tablet (100 mg total) by mouth daily. 07/14/19   Allegra Grana, FNP  losartan (COZAAR) 50 MG tablet Take 50 mg by mouth daily. 12/27/19   [provider]  metFORMIN (GLUCOPHAGE XR) 500 MG 24 hr tablet Take 1 tablet (500 mg total) by mouth every evening. 09/07/20   Allegra Grana, FNP  rosuvastatin (CRESTOR) 40 MG tablet Take 1 tablet (40 mg total) by mouth daily. 02/16/20   Allegra Grana,  FNP  sertraline (ZOLOFT) 100 MG tablet TAKE 1 TABLET BY MOUTH EVERYDAY AT BEDTIME 06/28/20   Allegra Grana, FNP     Allergies Lac bovis   Family History  Problem Relation Age of Onset  . Diabetes Father   . Hypertension Father   . Hypertension Brother   . Breast cancer Neg Hx   . Thyroid cancer Neg Hx     Social History Social History   Tobacco Use  . Smoking status: Former Smoker    Packs/day: 0.25    Years: 3.00    Pack years: 0.75    Types: Cigarettes    Quit date: 02/17/2002    Years since quitting: 18.7  . Smokeless tobacco: Never Used  Substance Use Topics  . Alcohol use: Yes    Alcohol/week: 0.0 standard drinks    Comment: Rare - 2  year  . Drug use: No    Review of Systems  Constitutional:   No fever or chills.  ENT:   No sore throat. No rhinorrhea. Cardiovascular:   No chest pain or syncope. Respiratory:   No dyspnea or cough. Gastrointestinal:   Negative for abdominal pain, vomiting and diarrhea.  Musculoskeletal:   Negative for focal pain or swelling All other systems reviewed and are negative except as documented above in ROS and HPI.  ____________________________________________   PHYSICAL EXAM:  VITAL SIGNS: ED Triage Vitals  Enc Vitals Group     BP 11/16/20 0032 (!) 170/102     Pulse Rate 11/16/20 0032 100     Resp 11/16/20 0032 20     Temp --      Temp src --      SpO2 11/16/20 0032 99 %     Weight 11/16/20 0034 190 lb (86.2 kg)     Height 11/16/20 0034 5\' 4"  (1.626 m)     Head Circumference --      Peak Flow --      Pain Score 11/16/20 0032 0     Pain Loc --      Pain Edu? --      Excl. in GC? --     Vital signs reviewed, nursing assessments reviewed.   Constitutional:   Alert and oriented. Non-toxic appearance. Eyes:   Conjunctivae are normal. EOMI. 11/18/20 ENT      Head:   Normocephalic and atraumatic.      Nose:   Wearing a mask.      Mouth/Throat:   Wearing a mask.      Neck:   No meningismus. Full ROM.  Tracheostomy  site is clean and noninflamed, no exudate or phlegm Hematological/Lymphatic/Immunilogical:   No cervical lymphadenopathy. Cardiovascular:   RRR. Symmetric bilateral radial and DP pulses.  No murmurs. Cap refill less than 2 seconds. Respiratory:   Normal respiratory effort without tachypnea/retractions. Breath sounds are clear and equal bilaterally. No wheezes/rales/rhonchi. Musculoskeletal:   Normal range of motion in all extremities.  Neurologic:   Normal speech and language.  Motor grossly intact. No acute focal neurologic deficits are appreciated.    ____________________________________________    LABS (pertinent positives/negatives) (all labs ordered are listed, but only abnormal results are displayed) Labs Reviewed - No data to display ____________________________________________   EKG    ____________________________________________    RADIOLOGY  No results found.  ____________________________________________   PROCEDURES TRACHEOSTOMY REPLACEMENT  Date/Time: 11/16/2020 1:19 AM Performed by: Sharman Cheek, MD Authorized by: Sharman Cheek, MD  Indications: malfunction Local anesthesia used: no  Anesthesia: Local anesthesia used: no  Sedation: Patient sedated: no  Tube type: double cannula Tube cuff: cuffless Tube size: 4.0 mm Patient tolerance: patient tolerated the procedure well with no immediate complications     ____________________________________________    CLINICAL IMPRESSION / ASSESSMENT AND PLAN / ED COURSE  Medications ordered in the ED: Medications - No data to display  Pertinent labs & imaging results that were available during my care of the patient were reviewed by me and considered in my medical decision making (see chart for details).  Carol [name] was evaluated in Emergency Department on 11/16/2020 for the symptoms described in the history of present illness. She was evaluated in the context of the global COVID-19  pandemic, which necessitated consideration that the patient might be at risk for infection with the SARS-CoV-2 virus that causes COVID-19. Institutional protocols and algorithms that pertain to the evaluation of patients at risk for COVID-19 are in a state of rapid change based on information released by regulatory bodies including the CDC and federal and state organizations. These policies and algorithms were followed during the patient's care in the ED.   Patient presents with broken tracheostomy cannula.  She uses a size 4 uncuffed Shiley, and was replaced with the same.  She feels well after replacing the tube, in baseline state of health and stable for discharge      ____________________________________________   FINAL CLINICAL IMPRESSION(S) / ED DIAGNOSES    Final diagnoses:  Complication of tracheostomy tube [facility])     ED Discharge Orders    None      Portions of this note were generated with dragon dictation software. Dictation errors may occur despite best attempts at proofreading.   Sharman Cheek, MD 11/16/20 0120

## 2020-11-16 NOTE — ED Notes (Signed)
Pt provided with number 4 trach. She replaced it herself with Dr Scotty Court standing by to assist. New trach placed and secured. Patient breathing easily.

## 2020-12-07 ENCOUNTER — Ambulatory Visit: Payer: No Typology Code available for payment source | Admitting: Family

## 2021-01-11 ENCOUNTER — Other Ambulatory Visit: Payer: Self-pay | Admitting: Family

## 2021-01-11 DIAGNOSIS — F32A Depression, unspecified: Secondary | ICD-10-CM

## 2021-01-13 ENCOUNTER — Other Ambulatory Visit: Payer: Self-pay | Admitting: Family

## 2021-01-13 DIAGNOSIS — I10 Essential (primary) hypertension: Secondary | ICD-10-CM

## 2021-01-14 ENCOUNTER — Other Ambulatory Visit: Payer: Self-pay

## 2021-01-14 ENCOUNTER — Encounter: Payer: Self-pay | Admitting: Family

## 2021-01-14 ENCOUNTER — Other Ambulatory Visit (INDEPENDENT_AMBULATORY_CARE_PROVIDER_SITE_OTHER): Payer: No Typology Code available for payment source

## 2021-01-14 ENCOUNTER — Ambulatory Visit: Payer: No Typology Code available for payment source | Admitting: Family

## 2021-01-14 VITALS — BP 138/90 | HR 110 | Temp 98.8°F | Ht 64.0 in | Wt 188.4 lb

## 2021-01-14 DIAGNOSIS — Z794 Long term (current) use of insulin: Secondary | ICD-10-CM | POA: Diagnosis not present

## 2021-01-14 DIAGNOSIS — I10 Essential (primary) hypertension: Secondary | ICD-10-CM | POA: Diagnosis not present

## 2021-01-14 DIAGNOSIS — F419 Anxiety disorder, unspecified: Secondary | ICD-10-CM | POA: Diagnosis not present

## 2021-01-14 DIAGNOSIS — E118 Type 2 diabetes mellitus with unspecified complications: Secondary | ICD-10-CM

## 2021-01-14 DIAGNOSIS — E119 Type 2 diabetes mellitus without complications: Secondary | ICD-10-CM | POA: Diagnosis not present

## 2021-01-14 DIAGNOSIS — F32A Depression, unspecified: Secondary | ICD-10-CM

## 2021-01-14 LAB — POCT GLYCOSYLATED HEMOGLOBIN (HGB A1C): Hemoglobin A1C: 9.6 % — AB (ref 4.0–5.6)

## 2021-01-14 MED ORDER — ROSUVASTATIN CALCIUM 40 MG PO TABS
40.0000 mg | ORAL_TABLET | Freq: Every day | ORAL | 3 refills | Status: DC
Start: 2021-01-14 — End: 2022-01-10

## 2021-01-14 MED ORDER — METFORMIN HCL ER 500 MG PO TB24: 500.0000 mg | ORAL_TABLET | Freq: Every evening | ORAL | 1 refills | Status: DC

## 2021-01-14 MED ORDER — OZEMPIC (0.25 OR 0.5 MG/DOSE) 2 MG/1.5ML ~~LOC~~ SOPN: PEN_INJECTOR | SUBCUTANEOUS | 3 refills | Status: DC

## 2021-01-14 NOTE — Assessment & Plan Note (Addendum)
Uncontrolled. Patient will send message to confirm losartan dose. Continue amlodipine 10mg . If on losartan 50mg  , will increase to 100mg  with BMP in one week. If on losartan 100mg , will start hctz 12.5mg 

## 2021-01-14 NOTE — Assessment & Plan Note (Addendum)
Lab Results  Component Value Date   HGBA1C 9.6 (A) 01/14/2021   Uncontrolled. Stop victoza, Start Ozempic. Resume metformin. Continue lantus 50 units for now with plans to decrease/discontinue. Close follow up.

## 2021-01-14 NOTE — Progress Notes (Signed)
Subjective:    Patient ID: Carol [name], female    DOB: 1981/03/20, 40 y.o.   MRN: [id number]  CC: Carol [name] is a 40 y.o. female who presents today for follow up.   HPI: Repeat pap smear as pap 01/2020 showed absent transformation zone. HPV negative. She declines today and would like to do this at follow up.   HTN- at home 135/80.  compliant with losartan ( not sure of Dose), and amlodipine 10mg .   Anxiety and depression- compliant with zoloft 100mg  . Feels well on this dose.   HLD- compliant with crestor 40mg   DM- FGB 120-130.  had been on  victoza 1.8mg  , 50 units basaglar. hasnt been on metformin 500mg . No hypoglycemic episodes.   ADHD- compliant with adderall 20mg . Feels well on this dose.    HISTORY:  Past Medical History:  Diagnosis Date  . Attention deficit disorder (ADD)   . Diabetes mellitus without complication (HCC) 2009  . Hypertension   . Subglottic stenosis    Trach collar   Past Surgical History:  Procedure Laterality Date  . BREAST BIOPSY Right    benign  . BREAST BIOPSY Left 10/2017   fibroadeoma  . BREAST BIOPSY Left 10/2017   fibroadenoma  . TRACHEOSTOMY  2010   Family History  Problem Relation Age of Onset  . Diabetes Father   . Hypertension Father   . Hypertension Brother   . Breast cancer Neg Hx   . Thyroid cancer Neg Hx     Allergies: [place] Current Outpatient Medications on File Prior to Visit  Medication Sig Dispense Refill  . amLODipine (NORVASC) 10 MG tablet TAKE 1 TABLET BY MOUTH DAILY 90 tablet 2  . amphetamine-dextroamphetamine (ADDERALL XR) 20 MG 24 hr capsule Take 1 capsule (20 mg total) by mouth daily. 30 capsule 0  . amphetamine-dextroamphetamine (ADDERALL XR) 20 MG 24 hr capsule Take 1 capsule (20 mg total) by mouth every morning. 30 capsule 0  . amphetamine-dextroamphetamine (ADDERALL XR) 20 MG 24 hr capsule Take 1 capsule (20 mg total) by mouth every morning. 30 capsule 0  . Continuous Blood Gluc Receiver  (FREESTYLE LIBRE 2 READER) DEVI 1 Device by Does not apply route daily. 1 each 0  . Continuous Blood Gluc Sensor (FREESTYLE LIBRE 2 SENSOR) MISC 2 Devices by Does not apply route every 14 (fourteen) days. 2 each 4  . etonogestrel (NEXPLANON) 68 MG IMPL implant 1 each by Subdermal route once.    glucose blood test strip Use as instructed up to 2 times per day 100 each 12  . Insulin Glargine (BASAGLAR KWIKPEN) 100 UNIT/ML INJECT 0.5 MLS (50 UNITS   TOTAL) INTO THE SKIN DAILY AT 10PM 45 mL 1  . liraglutide (VICTOZA) 18 MG/3ML SOPN Inject 1.8 mg into the skin daily. 3 mL 1  . losartan (COZAAR) 100 MG tablet Take 1 tablet (100 mg total) by mouth daily. 90 tablet 3  . losartan (COZAAR) 50 MG tablet Take 50 mg by mouth daily.    . sertraline (ZOLOFT) 100 MG tablet TAKE 1 TABLET BY MOUTH EVERYDAY AT BEDTIME 90 tablet 1  . ferrous sulfate 325 (65 FE) MG EC tablet TAKE 1 TABLET TWICE DAILY  WITH MEALS. (Patient not taking: Reported on 01/14/2021) 180 tablet 1   No current facility-administered medications on file prior to visit.    Social History   Tobacco Use  . Smoking status: Former Smoker    Packs/day: 0.25    Years:  3.00    Pack years: 0.75    Types: Cigarettes    Quit date: 02/17/2002    Years since quitting: 18.9  . Smokeless tobacco: Never Used  Substance Use Topics  . Alcohol use: Yes    Alcohol/week: 0.0 standard drinks    Comment: Rare - 2  year  . Drug use: No    Review of Systems  Constitutional: Negative for chills and fever.  Respiratory: Negative for cough.   Cardiovascular: Negative for chest pain and palpitations.  Gastrointestinal: Negative for nausea and vomiting.  Neurological: Negative for numbness.  Psychiatric/Behavioral: Negative for sleep disturbance.      Objective:    BP 138/90   Pulse (!) 110   Temp 98.8 F (37.1 C)   Ht 5\' 4"  (1.626 m)   Wt 188 lb 6.4 oz (85.5 kg)   SpO2 99%   BMI 32.34 kg/m  BP Readings from Last 3 Encounters:  01/14/21 138/90   11/16/20 (!) 170/102  09/07/20 122/84   Wt Readings from Last 3 Encounters:  01/14/21 188 lb 6.4 oz (85.5 kg)  11/16/20 190 lb (86.2 kg)  09/07/20 185 lb (83.9 kg)    Physical Exam Vitals reviewed.  Constitutional:      Appearance: She is well-developed and well-nourished.  Eyes:     Conjunctiva/sclera: Conjunctivae normal.  Cardiovascular:     Rate and Rhythm: Normal rate and regular rhythm.     Pulses: Normal pulses.     Heart sounds: Normal heart sounds.  Pulmonary:     Effort: Pulmonary effort is normal.     Breath sounds: Normal breath sounds. No wheezing, rhonchi or rales.  Skin:    General: Skin is warm and dry.  Neurological:     Mental Status: She is alert.  Psychiatric:        Mood and Affect: Mood and affect normal.        Speech: Speech normal.        Behavior: Behavior normal.        Thought Content: Thought content normal.        Assessment & Plan:   Problem List Items Addressed This Visit      Cardiovascular and Mediastinum   Benign essential HTN    Uncontrolled. Patient will send message to confirm losartan dose. Continue amlodipine 10mg . If on losartan 50mg  , will increase to 100mg  with BMP in one week. If on losartan 100mg , will start hctz 12.5mg        Relevant Orders   CBC with Differential/Platelet     Endocrine   DM type 2 (diabetes mellitus, type 2) (HCC) - Primary    Lab Results  Component Value Date   HGBA1C 9.6 (A) 01/14/2021   Uncontrolled. Stop victoza, Start Ozempic. Resume metformin. Continue lantus 50 units for now with plans to decrease/discontinue. Close follow up.       Relevant Medications   Semaglutide,0.25 or 0.5MG /DOS, (OZEMPIC, 0.25 OR 0.5 MG/DOSE,) 2 MG/1.5ML SOPN   metFORMIN (GLUCOPHAGE XR) 500 MG 24 hr tablet   Other Relevant Orders   Lipid panel   Comprehensive metabolic panel     Other   Anxiety and depression    Stable. Continue zoloft 50mg .        Other Visit Diagnoses    Type 2 diabetes mellitus  with complication, with long-term current use of insulin (HCC)       Relevant Medications   Semaglutide,0.25 or 0.5MG /DOS, (OZEMPIC, 0.25 OR 0.5 MG/DOSE,) 2 MG/1.5ML SOPN  metFORMIN (GLUCOPHAGE XR) 500 MG 24 hr tablet       I am having Carol [name] start on Ozempic (0.25 or 0.5 MG/DOSE). I am also having her maintain her glucose blood, losartan, Nexplanon, FreeStyle Libre 2 Reader, [employer] 2 Sensor, losartan, Basaglar KwikPen, liraglutide, ferrous sulfate, amphetamine-dextroamphetamine, amphetamine-dextroamphetamine, amphetamine-dextroamphetamine, sertraline, amLODipine, and metFORMIN.   Meds ordered this encounter  Medications  . Semaglutide,0.25 or 0.5MG /DOS, (OZEMPIC, 0.25 OR 0.5 MG/DOSE,) 2 MG/1.5ML SOPN    Sig: Start 0.25mg  [place] qwk x 4 weeks, then increase to 0.5mg  [place] qwk.    Dispense:  1.5 mL    Refill:  3    Order Specific Question:   Supervising Provider    Answer:   Darrick Huntsman, TERESA L [[id number]]  . metFORMIN (GLUCOPHAGE XR) 500 MG 24 hr tablet    Sig: Take 1 tablet (500 mg total) by mouth every evening.    Dispense:  90 tablet    Refill:  1    Order Specific Question:   Supervising Provider    Answer:   Sherlene Shams [[id number]]    Return precautions given.   Risks, benefits, and alternatives of the medications and treatment plan prescribed today were discussed, and patient expressed understanding.   Education regarding symptom management and diagnosis given to patient on AVS.  Continue to follow with Allegra Grana, FNP for routine health maintenance.   Jacolyn Reedy and I agreed with plan.   Rennie Plowman, FNP

## 2021-01-14 NOTE — Patient Instructions (Addendum)
Please confirm if you are on losartan 50mg  OR 100mg  tablet once per day  Stop victoza,  Start Ozempic  Resume metformin  Nice to see you always!

## 2021-01-17 ENCOUNTER — Telehealth: Payer: Self-pay | Admitting: *Deleted

## 2021-01-17 NOTE — Telephone Encounter (Signed)
Left message to return call to office need to know dosage of losartan patient is taking.

## 2021-01-17 NOTE — Assessment & Plan Note (Signed)
Stable Continue zoloft 50 mg 

## 2021-01-17 NOTE — Telephone Encounter (Signed)
-----   Message from Allegra Grana, FNP sent at 01/17/2021 10:12 AM EST ----- Call pt  Confirm losartan dose

## 2021-01-19 ENCOUNTER — Other Ambulatory Visit: Payer: No Typology Code available for payment source

## 2021-02-14 ENCOUNTER — Other Ambulatory Visit: Payer: Self-pay | Admitting: Family

## 2021-02-14 DIAGNOSIS — F909 Attention-deficit hyperactivity disorder, unspecified type: Secondary | ICD-10-CM

## 2021-02-16 ENCOUNTER — Encounter: Payer: Self-pay | Admitting: Family

## 2021-02-16 ENCOUNTER — Other Ambulatory Visit: Payer: Self-pay | Admitting: Family

## 2021-02-16 ENCOUNTER — Other Ambulatory Visit: Payer: Self-pay

## 2021-02-16 DIAGNOSIS — F909 Attention-deficit hyperactivity disorder, unspecified type: Secondary | ICD-10-CM

## 2021-02-16 MED ORDER — AMPHETAMINE-DEXTROAMPHET ER 20 MG PO CP24: 20.0000 mg | ORAL_CAPSULE | ORAL | 0 refills | Status: DC

## 2021-02-16 MED ORDER — AMPHETAMINE-DEXTROAMPHET ER 20 MG PO CP24: 20.0000 mg | ORAL_CAPSULE | Freq: Every day | ORAL | 0 refills | Status: DC

## 2021-03-16 ENCOUNTER — Ambulatory Visit: Payer: No Typology Code available for payment source | Admitting: Family

## 2021-03-16 DIAGNOSIS — Z0289 Encounter for other administrative examinations: Secondary | ICD-10-CM

## 2021-04-20 ENCOUNTER — Other Ambulatory Visit: Payer: Self-pay | Admitting: Family

## 2021-04-20 DIAGNOSIS — F909 Attention-deficit hyperactivity disorder, unspecified type: Secondary | ICD-10-CM

## 2021-04-20 NOTE — Telephone Encounter (Signed)
RX Refill:adderall Last Seen:01-14-21 Last ordered:02-16-21

## 2021-04-22 ENCOUNTER — Telehealth: Payer: Self-pay | Admitting: Family

## 2021-04-22 DIAGNOSIS — F909 Attention-deficit hyperactivity disorder, unspecified type: Secondary | ICD-10-CM

## 2021-04-22 NOTE — Telephone Encounter (Signed)
LMTCB

## 2021-04-22 NOTE — Telephone Encounter (Signed)
Call pt   Received refill request for adderall however appears two scripts not picked up Please ask her to always call pharmacy first to ensure no prescriptions there  Due for f/u ADHD , please sch in 6-8 weeks

## 2021-04-26 NOTE — Telephone Encounter (Signed)
Pt called back returning your call °

## 2021-04-27 NOTE — Telephone Encounter (Signed)
Just FYI patient will contact pharmacy. She stated that when she tried to refill from app it wouldn't let her & stated no refills available, but that is probably bc you have to do separate scripts for that med. Patient is scheduled 7/22 at 8am. That was soonest that worked for her scheduled & wanted to do repeat PAP first thing in morning to get over with.

## 2021-04-27 NOTE — Telephone Encounter (Signed)
noted 

## 2021-05-17 ENCOUNTER — Encounter: Payer: Self-pay | Admitting: Family

## 2021-05-18 ENCOUNTER — Other Ambulatory Visit: Payer: Self-pay

## 2021-05-18 DIAGNOSIS — I10 Essential (primary) hypertension: Secondary | ICD-10-CM

## 2021-05-18 MED ORDER — LOSARTAN POTASSIUM 100 MG PO TABS: 100.0000 mg | ORAL_TABLET | Freq: Every day | ORAL | 3 refills | Status: DC

## 2021-05-18 NOTE — Telephone Encounter (Signed)
After reading last appointment notes I have sent in losartan 100 mg to pharmacy & patient is scheduled to do CMP (was already ordered) to do in one week.

## 2021-05-25 ENCOUNTER — Other Ambulatory Visit (INDEPENDENT_AMBULATORY_CARE_PROVIDER_SITE_OTHER): Payer: No Typology Code available for payment source

## 2021-05-25 ENCOUNTER — Other Ambulatory Visit: Payer: Self-pay

## 2021-05-25 DIAGNOSIS — E119 Type 2 diabetes mellitus without complications: Secondary | ICD-10-CM

## 2021-05-25 DIAGNOSIS — Z794 Long term (current) use of insulin: Secondary | ICD-10-CM | POA: Diagnosis not present

## 2021-05-25 DIAGNOSIS — I10 Essential (primary) hypertension: Secondary | ICD-10-CM | POA: Diagnosis not present

## 2021-05-25 LAB — CBC WITH DIFFERENTIAL/PLATELET
Basophils Absolute: 0.1 10*3/uL (ref 0.0–0.1)
Basophils Relative: 1 % (ref 0.0–3.0)
Eosinophils Absolute: 0.1 10*3/uL (ref 0.0–0.7)
Eosinophils Relative: 1.7 % (ref 0.0–5.0)
HCT: 37.1 % (ref 36.0–46.0)
Hemoglobin: 12.1 g/dL (ref 12.0–15.0)
Lymphocytes Relative: 27.8 % (ref 12.0–46.0)
Lymphs Abs: 1.6 10*3/uL (ref 0.7–4.0)
MCHC: 32.7 g/dL (ref 30.0–36.0)
MCV: 80.5 fl (ref 78.0–100.0)
Monocytes Absolute: 0.4 10*3/uL (ref 0.1–1.0)
Monocytes Relative: 7.8 % (ref 3.0–12.0)
Neutro Abs: 3.5 10*3/uL (ref 1.4–7.7)
Neutrophils Relative %: 61.7 % (ref 43.0–77.0)
Platelets: 310 10*3/uL (ref 150.0–400.0)
RBC: 4.62 Mil/uL (ref 3.87–5.11)
RDW: 12.9 % (ref 11.5–15.5)
WBC: 5.6 10*3/uL (ref 4.0–10.5)

## 2021-05-25 LAB — LIPID PANEL
Cholesterol: 134 mg/dL (ref 0–200)
HDL: 49.6 mg/dL (ref >39.00)
LDL Cholesterol: 69 mg/dL (ref 0–99)
NonHDL: 84.61
Total CHOL/HDL Ratio: 3
Triglycerides: 76 mg/dL (ref 0.0–149.0)
VLDL: 15.2 mg/dL (ref 0.0–40.0)

## 2021-05-25 LAB — COMPREHENSIVE METABOLIC PANEL
ALT: 10 U/L (ref 0–35)
AST: 10 U/L (ref 0–37)
Albumin: 3.9 g/dL (ref 3.5–5.2)
Alkaline Phosphatase: 94 U/L (ref 39–117)
BUN: 14 mg/dL (ref 6–23)
CO2: 30 mEq/L (ref 19–32)
Calcium: 9.2 mg/dL (ref 8.4–10.5)
Chloride: 99 mEq/L (ref 96–112)
Creatinine, Ser: 0.74 mg/dL (ref 0.40–1.20)
GFR: 100.16 mL/min (ref >60.00)
Glucose, Bld: 305 mg/dL — ABNORMAL HIGH (ref 70–99)
Potassium: 4.3 mEq/L (ref 3.5–5.1)
Sodium: 134 mEq/L — ABNORMAL LOW (ref 135–145)
Total Bilirubin: 0.3 mg/dL (ref 0.2–1.2)
Total Protein: 7.3 g/dL (ref 6.0–8.3)

## 2021-05-31 ENCOUNTER — Telehealth: Payer: Self-pay

## 2021-05-31 ENCOUNTER — Other Ambulatory Visit: Payer: Self-pay | Admitting: Family

## 2021-05-31 DIAGNOSIS — Z794 Long term (current) use of insulin: Secondary | ICD-10-CM

## 2021-05-31 MED ORDER — OZEMPIC (1 MG/DOSE) 2 MG/1.5ML ~~LOC~~ SOPN: 1.0000 mg | PEN_INJECTOR | SUBCUTANEOUS | 2 refills | Status: DC

## 2021-05-31 NOTE — Telephone Encounter (Signed)
LMTCB for labs. 

## 2021-05-31 NOTE — Telephone Encounter (Signed)
LM once again asking patient to call back to confirm that she was not taking Victoza & that Claris Che had sent in the 1mg  dose of Ozempic.

## 2021-07-08 ENCOUNTER — Encounter: Payer: Self-pay | Admitting: Family

## 2021-07-08 ENCOUNTER — Other Ambulatory Visit: Payer: Self-pay

## 2021-07-08 ENCOUNTER — Ambulatory Visit: Payer: No Typology Code available for payment source | Admitting: Family

## 2021-07-08 ENCOUNTER — Other Ambulatory Visit (HOSPITAL_COMMUNITY)
Admission: RE | Admit: 2021-07-08 | Discharge: 2021-07-08 | Disposition: A | Discharge status: Home / Self Care | Payer: No Typology Code available for payment source | Source: Ambulatory Visit | Attending: Family | Admitting: Family

## 2021-07-08 VITALS — BP 130/86 | HR 109 | Temp 96.1°F | Ht 64.02 in | Wt 193.0 lb

## 2021-07-08 DIAGNOSIS — E119 Type 2 diabetes mellitus without complications: Secondary | ICD-10-CM

## 2021-07-08 DIAGNOSIS — Z0000 Encounter for general adult medical examination without abnormal findings: Secondary | ICD-10-CM | POA: Insufficient documentation

## 2021-07-08 DIAGNOSIS — Z794 Long term (current) use of insulin: Secondary | ICD-10-CM

## 2021-07-08 DIAGNOSIS — Z1231 Encounter for screening mammogram for malignant neoplasm of breast: Secondary | ICD-10-CM | POA: Diagnosis not present

## 2021-07-08 DIAGNOSIS — I10 Essential (primary) hypertension: Secondary | ICD-10-CM

## 2021-07-08 DIAGNOSIS — E785 Hyperlipidemia, unspecified: Secondary | ICD-10-CM | POA: Diagnosis not present

## 2021-07-08 LAB — POCT GLYCOSYLATED HEMOGLOBIN (HGB A1C): Hemoglobin A1C: 10.2 % — AB (ref 4.0–5.6)

## 2021-07-08 NOTE — Assessment & Plan Note (Signed)
Lab Results  Component Value Date   HGBA1C 10.2 (A) 07/08/2021   Uncontrolled.  Patient will continue Ozempic, she is only had 1 dose of Ozempic 0.25.  She will also resume metformin 500 mg and we will slowly titrate this medication.  She will be continued on Basaglar 50 units however likely will decrease/dc this over time.  She is provided with blood sugar sheets and asked to check to blood sugars daily and to return to the office in 2 weeks time.  Follow-up in 1 month.  We will closely monitor patient until we can regain control of DM.  Strongly advised importance of scheduling her annual eye exam.  Patient verbalized understanding

## 2021-07-08 NOTE — Patient Instructions (Addendum)
Call me in two weeks time with fasting blood sugar and post prandial blood sugar once per day after LARGEST meal , this has to be taken 2 hours after a meal.  Return blood sugar logs.   Call me with any blood sugar consisently greater than 200 OR ANY blood sugars less than 70.   Restart metformin 500mg  once per day  Continue ozempic 0.25mg  once per week and then in 4 weeks time call the office and I will increase to 0.5mg .  Referral to GI to discuss colonoscopy  Let know if you dont hear back within a week in regards to an appointment being scheduled.  Health Maintenance, Female Adopting a healthy lifestyle and getting preventive care are important in promoting health and wellness. Ask your health care provider about: The right schedule for you to have regular tests and exams. Things you can do on your own to prevent diseases and keep yourself healthy. What should I know about diet, weight, and exercise? Eat a healthy diet  Eat a diet that includes plenty of vegetables, fruits, low-fat dairy products, and lean protein. Do not eat a lot of foods that are high in solid fats, added sugars, or sodium.  Maintain a healthy weight Body mass index (BMI) is used to identify weight problems. It estimates body fat based on height and weight. Your health care provider can help determineyour BMI and help you achieve or maintain a healthy weight. Get regular exercise Get regular exercise. This is one of the most important things you can do for your health. Most adults should: Exercise for at least 150 minutes each week. The exercise should increase your heart rate and make you sweat (moderate-intensity exercise). Do strengthening exercises at least twice a week. This is in addition to the moderate-intensity exercise. Spend less time sitting. Even light physical activity can be beneficial. Watch cholesterol and blood lipids Have your blood tested for lipids and cholesterol at 42 years of age, then  havethis test every 5 years. Have your cholesterol levels checked more often if: Your lipid or cholesterol levels are high. You are older than 42 years of age. You are at high risk for heart disease. What should I know about cancer screening? Depending on your health history and family history, you may need to have cancer screening at various ages. This may include screening for: Breast cancer. Cervical cancer. Colorectal cancer. Skin cancer. Lung cancer. What should I know about heart disease, diabetes, and high blood pressure? Blood pressure and heart disease High blood pressure causes heart disease and increases the risk of stroke. This is more likely to develop in people who have high blood pressure readings, are of African descent, or are overweight. Have your blood pressure checked: Every 3-5 years if you are 39-5 years of age. Every year if you are 48 years old or older. Diabetes Have regular diabetes screenings. This checks your fasting blood sugar level. Have the screening done: Once every three years after age 36 if you are at a normal weight and have a low risk for diabetes. More often and at a younger age if you are overweight or have a high risk for diabetes. What should I know about preventing infection? Hepatitis B If you have a higher risk for hepatitis B, you should be screened for this virus. Talk with your health care provider to find out if you are at risk forhepatitis B infection. Hepatitis C Testing is recommended for: Everyone born from [birth year] through 1967.  Anyone with known risk factors for hepatitis C. Sexually transmitted infections (STIs) Get screened for STIs, including gonorrhea and chlamydia, if: You are sexually active and are younger than 42 years of age. You are older than 42 years of age and your health care provider tells you that you are at risk for this type of infection. Your sexual activity has changed since you were last screened, and you are at  increased risk for chlamydia or gonorrhea. Ask your health care provider if you are at risk. Ask your health care provider about whether you are at high risk for HIV. Your health care provider may recommend a prescription medicine to help prevent HIV infection. If you choose to take medicine to prevent HIV, you should first get tested for HIV. You should then be tested every 3 months for as long as you are taking the medicine. Pregnancy If you are about to stop having your period (premenopausal) and you may become pregnant, seek counseling before you get pregnant. Take 400 to 800 micrograms (mcg) of folic acid every day if you become pregnant. Ask for birth control (contraception) if you want to prevent pregnancy. Osteoporosis and menopause Osteoporosis is a disease in which the bones lose minerals and strength with aging. This can result in bone fractures. If you are 46 years old or older, or if you are at risk for osteoporosis and fractures, ask your health care provider if you should: Be screened for bone loss. Take a calcium or vitamin D supplement to lower your risk of fractures. Be given hormone replacement therapy (HRT) to treat symptoms of menopause. Follow these instructions at home: Lifestyle Do not use any products that contain nicotine or tobacco, such as cigarettes, e-cigarettes, and chewing tobacco. If you need help quitting, ask your health care provider. Do not use street drugs. Do not share needles. Ask your health care provider for help if you need support or information about quitting drugs. Alcohol use Do not drink alcohol if: Your health care provider tells you not to drink. You are pregnant, may be pregnant, or are planning to become pregnant. If you drink alcohol: Limit how much you use to 0-1 drink a day. Limit intake if you are breastfeeding. Be aware of how much alcohol is in your drink. In the [place] one drink equals one 12 oz bottle of beer (355 mL), one 5 oz glass  of wine (148 mL), or one 1 oz glass of hard liquor (44 mL). General instructions Schedule regular health, dental, and eye exams. Stay current with your vaccines. Tell your health care provider if: You often feel depressed. You have ever been abused or do not feel safe at home. Summary Adopting a healthy lifestyle and getting preventive care are important in promoting health and wellness. Follow your health care provider's instructions about healthy diet, exercising, and getting tested or screened for diseases. Follow your health care provider's instructions on monitoring your cholesterol and blood pressure. This information is not intended to replace advice given to you by your health care provider. Make sure you discuss any questions you have with your healthcare provider. Document Revised: 11/27/2018 Document Reviewed: 11/27/2018 Elsevier Patient Education  2022 [employer].

## 2021-07-08 NOTE — Progress Notes (Signed)
Subjective:    Patient ID: Carol [name], female    DOB: [birth year], 40 y.o.   MRN: [id number]  CC: Carol [name] is a 40 y.o. female who presents today for physical exam and follow up    HPI:  Feels well today, no complaints.   HTN- compliant with norvasc 10mg  losartan 100mg  At home 127/85.   No cp  HLD- compliant with crestor .   DM- compliant with basaglar 50 units. She has also stopped metformin 500mg  a couple of months ago as she thought that she was to stop this medication. She has only taken ozempic 0.25mg  once.  Tolerated well. No Nausea.  Endorses blurry vision.  She is due for her annual eye exam  Colorectal Cancer Screening: family h/o. No changes to bowel habits, rectal bleeding, abdominal pain  Breast Cancer Screening: Mammogram due Cervical Cancer Screening: due         Tetanus - UTD         Hepatitis C screening - Candidate for; discussed doing  follow-up  Labs: Screening labs done prior Alcohol use:  none Smoking/tobacco use: Nonsmoker.     HISTORY:  Past Medical History:  Diagnosis Date   Attention deficit disorder (ADD)    Diabetes mellitus without complication (HCC) 2009   Hypertension    Subglottic stenosis    Trach collar    Past Surgical History:  Procedure Laterality Date   BREAST BIOPSY Right    benign   BREAST BIOPSY Left 10/2017   fibroadeoma   BREAST BIOPSY Left 10/2017   fibroadenoma   TRACHEOSTOMY  2010   Family History  Problem Relation Age of Onset   Diabetes Father    Hypertension Father    Hypertension Brother    Colon cancer Maternal Aunt 50   Lung cancer Maternal Aunt    Breast cancer Neg Hx    Thyroid cancer Neg Hx       ALLERGIES: [place]  Current Outpatient Medications on File Prior to Visit  Medication Sig Dispense Refill   amLODipine (NORVASC) 10 MG tablet TAKE 1 TABLET BY MOUTH DAILY 90 tablet 2   amphetamine-dextroamphetamine (ADDERALL XR) 20 MG 24 hr capsule Take 1 capsule (20 mg total) by  mouth daily. 30 capsule 0   amphetamine-dextroamphetamine (ADDERALL XR) 20 MG 24 hr capsule Take 1 capsule (20 mg total) by mouth every morning. 30 capsule 0   amphetamine-dextroamphetamine (ADDERALL XR) 20 MG 24 hr capsule Take 1 capsule (20 mg total) by mouth every morning. 30 capsule 0   Continuous Blood Gluc Receiver (FREESTYLE LIBRE 2 READER) DEVI 1 Device by Does not apply route daily. 1 each 0   Continuous Blood Gluc Sensor (FREESTYLE LIBRE 2 SENSOR) MISC 2 Devices by Does not apply route every 14 (fourteen) days. 2 each 4   etonogestrel (NEXPLANON) 68 MG IMPL implant 1 each by Subdermal route once.     glucose blood test strip Use as instructed up to 2 times per day 100 each 12   Insulin Glargine (BASAGLAR KWIKPEN) 100 UNIT/ML INJECT 0.5 MLS (50 UNITS   TOTAL) INTO THE SKIN DAILY AT 10PM 45 mL 1   losartan (COZAAR) 100 MG tablet Take 1 tablet (100 mg total) by mouth daily. 90 tablet 3   metFORMIN (GLUCOPHAGE XR) 500 MG 24 hr tablet Take 1 tablet (500 mg total) by mouth every evening. 90 tablet 1   rosuvastatin (CRESTOR) 40 MG tablet Take 1 tablet (40 mg total) by mouth daily.  90 tablet 3   Semaglutide, 1 MG/DOSE, (OZEMPIC, 1 MG/DOSE,) 2 MG/1.5ML SOPN Inject 1 mg into the skin once a week. 3 mL 2   sertraline (ZOLOFT) 100 MG tablet TAKE 1 TABLET BY MOUTH EVERYDAY AT BEDTIME 90 tablet 1   ferrous sulfate 325 (65 FE) MG EC tablet TAKE 1 TABLET TWICE DAILY  WITH MEALS. (Patient not taking: No sig reported) 180 tablet 1   No current facility-administered medications on file prior to visit.    Social History   Tobacco Use   Smoking status: Former    Packs/day: 0.25    Years: 3.00    Pack years: 0.75    Types: Cigarettes    Quit date: 02/17/2002    Years since quitting: 19.4   Smokeless tobacco: Never  Substance Use Topics   Alcohol use: Yes    Alcohol/week: 0.0 standard drinks    Comment: Rare - 2  year   Drug use: No    Review of Systems  Constitutional:  Negative for chills,  fever and unexpected weight change.  HENT:  Negative for congestion.   Respiratory:  Negative for cough.   Cardiovascular:  Negative for chest pain, palpitations and leg swelling.  Gastrointestinal:  Negative for nausea and vomiting.  Musculoskeletal:  Negative for arthralgias and myalgias.  Skin:  Negative for rash.  Neurological:  Negative for headaches.  Hematological:  Negative for adenopathy.  Psychiatric/Behavioral:  Negative for confusion.      Objective:    BP 130/86 (BP Location: Left Arm, Patient Position: Sitting)   Pulse (!) 109   Temp (!) 96.1 F (35.6 C)   Ht 5' 4.02" (1.626 m)   Wt 193 lb (87.5 kg)   SpO2 98%   BMI 33.11 kg/m   BP Readings from Last 3 Encounters:  07/08/21 130/86  01/14/21 138/90  11/16/20 (!) 170/102   Wt Readings from Last 3 Encounters:  07/08/21 193 lb (87.5 kg)  01/14/21 188 lb 6.4 oz (85.5 kg)  11/16/20 190 lb (86.2 kg)    Physical Exam Vitals reviewed.  Constitutional:      Appearance: Normal appearance. She is well-developed.  Eyes:     Conjunctiva/sclera: Conjunctivae normal.  Neck:     Thyroid: No thyroid mass or thyromegaly.  Cardiovascular:     Rate and Rhythm: Normal rate and regular rhythm.     Pulses: Normal pulses.     Heart sounds: Normal heart sounds.  Pulmonary:     Effort: Pulmonary effort is normal.     Breath sounds: Normal breath sounds. No wheezing, rhonchi or rales.  Chest:  Breasts:    Breasts are symmetrical.     Right: No inverted nipple, mass, nipple discharge, skin change or tenderness.     Left: No inverted nipple, mass, nipple discharge, skin change or tenderness.  Abdominal:     General: Bowel sounds are normal. There is no distension.     Palpations: Abdomen is soft. Abdomen is not rigid. There is no fluid wave or mass.     Tenderness: There is no abdominal tenderness. There is no guarding or rebound.  Genitourinary:    Cervix: No cervical motion tenderness, discharge or friability.      Uterus: Not enlarged, not fixed and not tender.      Adnexa:        Right: No mass, tenderness or fullness.         Left: No mass, tenderness or fullness.       Comments:  Pap performed. No CMT. Unable to appreciated ovaries. Lymphadenopathy:     Head:     Right side of head: No submental, submandibular, tonsillar, preauricular, posterior auricular or occipital adenopathy.     Left side of head: No submental, submandibular, tonsillar, preauricular, posterior auricular or occipital adenopathy.     Cervical:     Right cervical: No superficial, deep or posterior cervical adenopathy.    Left cervical: No superficial, deep or posterior cervical adenopathy.     Upper Body:     Right upper body: No pectoral adenopathy.     Left upper body: No pectoral adenopathy.  Skin:    General: Skin is warm and dry.  Neurological:     Mental Status: She is alert.  Psychiatric:        Speech: Speech normal.        Behavior: Behavior normal.        Thought Content: Thought content normal.       Assessment & Plan:   Problem List Items Addressed This Visit       Cardiovascular and Mediastinum   Benign essential HTN    Overall controlled, discussed importance of reaching less than 120/80.  We will continue Norvasc 10 mg and losartan 100 mg at this time.  Anticipating weight loss with ozempic titration and reduction of blood pressure.  We will closely follow.  1 month follow-up         Endocrine   DM type 2 (diabetes mellitus, type 2) (HCC)    Lab Results  Component Value Date   HGBA1C 10.2 (A) 07/08/2021  Uncontrolled.  Patient will continue Ozempic, she is only had 1 dose of Ozempic 0.25.  She will also resume metformin 500 mg and we will slowly titrate this medication.  She will be continued on Basaglar 50 units however likely will decrease/dc this over time.  She is provided with blood sugar sheets and asked to check to blood sugars daily and to return to the office in 2 weeks time.  Follow-up in  1 month.  We will closely monitor patient until we can regain control of DM.  Strongly advised importance of scheduling her annual eye exam.  Patient verbalized understanding      Relevant Orders   POCT A1C (Completed)     Other   HLD (hyperlipidemia)    Controlled.  Continue crestor 40mg .        Routine physical examination - Primary    Clinical breast exam and Pap smear performed today.  Immunizations up-to-date.  Patient has family history of colon cancer with her maternal aunt.  She would like to have a discussion with GI in regards to an earlier colonoscopy screening prior to age of 89.    I provided a referral for her consult.       Relevant Orders   Ambulatory referral to Gastroenterology   Cytology - PAP( [place])   Other Visit Diagnoses     Encounter for screening mammogram for malignant neoplasm of breast       Relevant Orders   MM 3D SCREEN BREAST BILATERAL        I am having Joelene MillinKimberly M. Demeter maintain her glucose blood, Nexplanon, FreeStyle Libre 2 Reader, [place] ResourcesFreeStyle Libre 2 Sensor, [place] FinancialBasaglar KwikPen, ferrous sulfate, sertraline, amLODipine, rosuvastatin, metFORMIN, amphetamine-dextroamphetamine, amphetamine-dextroamphetamine, amphetamine-dextroamphetamine, losartan, and Ozempic (1 MG/DOSE).   No orders of the defined types were placed in this encounter.   Return precautions given.   Risks, benefits, and alternatives of  the medications and treatment plan prescribed today were discussed, and patient expressed understanding.   Education regarding symptom management and diagnosis given to patient on AVS.   Continue to follow with Allegra Grana, FNP for routine health maintenance.   Jacolyn Reedy and I agreed with plan.   Rennie Plowman, FNP

## 2021-07-08 NOTE — Progress Notes (Signed)
I called patient & let her know that she was scheduled 07/20/21 at 8:20am at [facility] for Mammogram.

## 2021-07-08 NOTE — Assessment & Plan Note (Signed)
Overall controlled, discussed importance of reaching less than 120/80.  We will continue Norvasc 10 mg and losartan 100 mg at this time.  Anticipating weight loss with ozempic titration and reduction of blood pressure.  We will closely follow.  1 month follow-up

## 2021-07-08 NOTE — Assessment & Plan Note (Addendum)
Clinical breast exam and Pap smear performed today.  Immunizations up-to-date.  Patient has family history of colon cancer with her maternal aunt.  She would like to have a discussion with GI in regards to an earlier colonoscopy screening prior to age of 81.    I provided a referral for her consult.

## 2021-07-08 NOTE — Assessment & Plan Note (Signed)
Controlled. Continue crestor 40mg 

## 2021-07-10 ENCOUNTER — Other Ambulatory Visit: Payer: Self-pay | Admitting: Family

## 2021-07-10 DIAGNOSIS — F419 Anxiety disorder, unspecified: Secondary | ICD-10-CM

## 2021-07-10 DIAGNOSIS — F32A Depression, unspecified: Secondary | ICD-10-CM

## 2021-07-12 ENCOUNTER — Other Ambulatory Visit: Payer: Self-pay | Admitting: Family

## 2021-07-12 DIAGNOSIS — E1100 Type 2 diabetes mellitus with hyperosmolarity without nonketotic hyperglycemic-hyperosmolar coma (NKHHC): Secondary | ICD-10-CM

## 2021-07-13 LAB — CYTOLOGY - PAP
Adequacy: ABSENT
Comment: NEGATIVE
Diagnosis: NEGATIVE
High risk HPV: NEGATIVE

## 2021-07-20 ENCOUNTER — Other Ambulatory Visit: Payer: Self-pay

## 2021-07-20 ENCOUNTER — Ambulatory Visit
Admission: RE | Admit: 2021-07-20 | Discharge: 2021-07-20 | Disposition: A | Discharge status: Home / Self Care | Payer: No Typology Code available for payment source | Source: Ambulatory Visit | Attending: Family | Admitting: Family

## 2021-07-20 DIAGNOSIS — Z1231 Encounter for screening mammogram for malignant neoplasm of breast: Secondary | ICD-10-CM | POA: Diagnosis present

## 2021-08-30 LAB — HM DIABETES EYE EXAM

## 2021-08-31 ENCOUNTER — Other Ambulatory Visit: Payer: Self-pay | Admitting: Family

## 2021-08-31 DIAGNOSIS — F909 Attention-deficit hyperactivity disorder, unspecified type: Secondary | ICD-10-CM

## 2021-09-02 ENCOUNTER — Other Ambulatory Visit: Payer: Self-pay | Admitting: Family

## 2021-09-02 ENCOUNTER — Telehealth: Payer: Self-pay | Admitting: Family

## 2021-09-02 DIAGNOSIS — F909 Attention-deficit hyperactivity disorder, unspecified type: Secondary | ICD-10-CM

## 2021-09-02 MED ORDER — AMPHETAMINE-DEXTROAMPHET ER 20 MG PO CP24: 20.0000 mg | ORAL_CAPSULE | ORAL | 0 refills | Status: DC

## 2021-09-02 MED ORDER — AMPHETAMINE-DEXTROAMPHET ER 20 MG PO CP24: 20.0000 mg | ORAL_CAPSULE | Freq: Every day | ORAL | 0 refills | Status: DC

## 2021-09-02 NOTE — Telephone Encounter (Signed)
Pt has appointment 10/11.

## 2021-09-02 NOTE — Telephone Encounter (Signed)
Call pt  Refilled adderall  She needs an appt

## 2021-09-09 ENCOUNTER — Encounter: Payer: Self-pay | Admitting: Family

## 2021-09-09 ENCOUNTER — Telehealth: Payer: Self-pay | Admitting: Family

## 2021-09-09 DIAGNOSIS — F909 Attention-deficit hyperactivity disorder, unspecified type: Secondary | ICD-10-CM

## 2021-09-09 MED ORDER — AMPHETAMINE-DEXTROAMPHET ER 20 MG PO CP24: 20.0000 mg | ORAL_CAPSULE | Freq: Every day | ORAL | 0 refills | Status: DC

## 2021-09-09 NOTE — Telephone Encounter (Signed)
Called and spoke with [place]. Wafaa states that the prescription that was sent to [facility] was declined for an unknown reason. She states that she called them and was informed that they do not know why the prescription for 09/09/21 was declined. Sipta from [facility] called in asking for [place] to send in a local [facility] [place]. Pt states that she has been out of her medication for a week and would like something sent in to avoid the wait on mail order delivery. Please Advise.

## 2021-09-09 NOTE — Telephone Encounter (Signed)
Sipta from [facility] is calling on behalf on the patient to request that her adderrall [facility] Address [address] today.Please advise.

## 2021-09-09 NOTE — Telephone Encounter (Signed)
Patient called about the status of the note below. 

## 2021-09-09 NOTE — Telephone Encounter (Signed)
I sent a 1 month supply to her pharmacy.  Future refills to come from PCP.

## 2021-09-12 NOTE — Telephone Encounter (Signed)
Called the patient to inform her that her medicatoin has been sent in. Left a message to call back.

## 2021-09-13 NOTE — Telephone Encounter (Signed)
Left a message to call back.

## 2021-09-14 NOTE — Telephone Encounter (Signed)
Called and spoke with [place]. Cadience verbalized understanding and had no further questions.

## 2021-09-27 ENCOUNTER — Ambulatory Visit: Payer: No Typology Code available for payment source | Admitting: Family

## 2021-10-11 ENCOUNTER — Other Ambulatory Visit: Payer: Self-pay | Admitting: Family

## 2021-10-11 DIAGNOSIS — I10 Essential (primary) hypertension: Secondary | ICD-10-CM

## 2021-10-26 ENCOUNTER — Telehealth (INDEPENDENT_AMBULATORY_CARE_PROVIDER_SITE_OTHER): Payer: No Typology Code available for payment source | Admitting: Family

## 2021-10-26 ENCOUNTER — Other Ambulatory Visit: Payer: Self-pay

## 2021-10-26 DIAGNOSIS — F419 Anxiety disorder, unspecified: Secondary | ICD-10-CM

## 2021-10-26 DIAGNOSIS — I10 Essential (primary) hypertension: Secondary | ICD-10-CM | POA: Diagnosis not present

## 2021-10-26 DIAGNOSIS — Z794 Long term (current) use of insulin: Secondary | ICD-10-CM

## 2021-10-26 DIAGNOSIS — M25532 Pain in left wrist: Secondary | ICD-10-CM

## 2021-10-26 DIAGNOSIS — F909 Attention-deficit hyperactivity disorder, unspecified type: Secondary | ICD-10-CM | POA: Diagnosis not present

## 2021-10-26 DIAGNOSIS — E119 Type 2 diabetes mellitus without complications: Secondary | ICD-10-CM | POA: Diagnosis not present

## 2021-10-26 DIAGNOSIS — F32A Depression, unspecified: Secondary | ICD-10-CM

## 2021-10-26 MED ORDER — AMPHETAMINE-DEXTROAMPHET ER 20 MG PO CP24: 20.0000 mg | ORAL_CAPSULE | ORAL | 0 refills | Status: DC

## 2021-10-26 MED ORDER — AMPHETAMINE-DEXTROAMPHET ER 20 MG PO CP24: 20.0000 mg | ORAL_CAPSULE | Freq: Every day | ORAL | 0 refills | Status: DC

## 2021-10-26 MED ORDER — OZEMPIC (1 MG/DOSE) 2 MG/1.5ML ~~LOC~~ SOPN: 1.0000 mg | PEN_INJECTOR | SUBCUTANEOUS | 2 refills | Status: DC

## 2021-10-26 NOTE — Assessment & Plan Note (Addendum)
FBG 100-150. Improved. Increase ozempic 1mg  and decrease basaglar to 44 units. Continue metformin 500mg  and she titrate as tolerated with GI AE. She will drop off blood sugar readings of FBG and PP so I can adjust insulin

## 2021-10-26 NOTE — Patient Instructions (Addendum)
Call to schedule xray of wrist Ice 20 min/day twice per day Voltaren  gel which is over the counter  Let me know if no  improvement  Goal of fasting blood sugar is between 70-120. If in this range, we are reaching our target a1c ( goal 6.5%)   Increase ozempic to 1mg  Decrease basaglar to 44 units.  Monitor for low blood sugars  Bring blood sugar log to office in 1-2 weeks so I can further adjust insulin.    Please check fasting blood sugar in the morning time once or twice a week.  You may also check if you feel like you are having a low episode or particularly high episode of blood sugar.  If blood sugars increase, I may advise you to check blood sugar after your largest meal.  You specifically do this TWO hours after largest meal with the goal of being less than 180.  If blood sugar is checked sooner than 2 hours after largest meal, and it will be  expected to be elevated. You must wait 2 hours.   If your blood sugar is less than 180 hours after your largest meal, again we are reaching our target a1c goal   Call [facility] clinic if: BG < 70 or > 300.   If you have any symptoms of low blood sugar ( sweating, shakiness, lightheaded, dizzy) that you notify me. If you have a low, please drink a glass of orange juice and recheck blood sugar every 5 minutes until you don't feel symptomatic AND blood sugar is above 80.

## 2021-10-26 NOTE — Assessment & Plan Note (Signed)
Chronic stable, continue zoloft 100mg 

## 2021-10-26 NOTE — Assessment & Plan Note (Addendum)
H/o fracture. Pending xr wrist. Advised ice, voltaren gel otc. 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 She will let me know how she is doing

## 2021-10-26 NOTE — Assessment & Plan Note (Signed)
Slightly increased. Deferred changing medication with potential for weight loss with increase of ozempic. Continue Norvasc 10 mg, losartan 100 mg.

## 2021-10-26 NOTE — Assessment & Plan Note (Signed)
Chronic, stable. Continue adderall 20mg . Refilled.  I looked up patient on [place] Controlled Substances Reporting System PMP AWARE and saw no activity that raised concern of inappropriate use.

## 2021-10-26 NOTE — Progress Notes (Signed)
Virtual Visit via Video Note  I connected with@  on 10/26/21 at  4:00 PM EST by a video enabled telemedicine application and verified that I am speaking with the correct person using two identifiers.  Location patient: home Location provider:work  Persons participating in the virtual visit: patient, provider  I discussed the limitations of evaluation and management by telemedicine and the availability of in person appointments. The patient expressed understanding and agreed to proceed.   HPI: Left medial wrist pain for years , off and on.  More noticeable of late.  No numbness, swelling, redness, rash.  Bothered with twisting movements.  H/o left wrist fracture without surgery.  She types all day for work.   ADHD-Compliant with Adderall 20 mg . Medication works well for her. No increased anxiety, trouble sleeping, palpitations. She requests refill today.   hypertension-compliant with Norvasc 10 mg, losartan 100 mg. No cp. At home 128/86.   DM-compliant with Ozempic 0.5mg , metformin 500 mg, Basaglar 50 units qam. Appetite has decreased.  No hypoglycemic episodes. FBG 150, 100.  Anxiety and depression- compliant with zoloft 100mg  and feel working well for her.   ROS: See pertinent positives and negatives per HPI.    EXAM:  VITALS per patient if applicable: BP 135/90   Ht 5\' 4"  (1.626 m)   Wt 195 lb (88.5 kg)   BMI 33.47 kg/m  BP Readings from Last 3 Encounters:  10/26/21 135/90  07/08/21 130/86  01/14/21 138/90   Wt Readings from Last 3 Encounters:  10/26/21 195 lb (88.5 kg)  07/08/21 193 lb (87.5 kg)  01/14/21 188 lb 6.4 oz (85.5 kg)    GENERAL: alert, oriented, appears well and in no acute distress  HEENT: atraumatic, conjunttiva clear, no obvious abnormalities on inspection of external nose and ears  NECK: normal movements of the head and neck  LUNGS: on inspection no signs of respiratory distress, breathing rate appears normal, no obvious gross SOB, gasping  or wheezing  CV: no obvious cyanosis  MS: moves all visible extremities without noticeable abnormality. She is moving left wrist freely. No obvious swelling, erythema, edema seen.   PSYCH/NEURO: pleasant and cooperative, no obvious depression or anxiety, speech and thought processing grossly intact  ASSESSMENT AND PLAN:  Discussed the following assessment and plan:  Problem List Items Addressed This Visit       Cardiovascular and Mediastinum   Benign essential HTN    Slightly increased. Deferred changing medication with potential for weight loss with increase of ozempic. Continue Norvasc 10 mg, losartan 100 mg.        Endocrine   DM type 2 (diabetes mellitus, type 2) (HCC)    FBG 100-150. Improved. Increase ozempic 1mg  and decrease basaglar to 44 units. Continue metformin 500mg  and she titrate as tolerated with GI AE. She will drop off blood sugar readings of FBG and PP so I can adjust insulin      Relevant Medications   Semaglutide, 1 MG/DOSE, (OZEMPIC, 1 MG/DOSE,) 2 MG/1.5ML SOPN   Other Relevant Orders   Hemoglobin A1c   Comprehensive metabolic panel   Microalbumin / creatinine urine ratio     Other   ADHD (attention deficit hyperactivity disorder)    Chronic, stable. Continue adderall 20mg . Refilled.  I looked up patient on [place] Controlled Substances Reporting System PMP AWARE and saw no activity that raised concern of inappropriate use.        Relevant Medications   amphetamine-dextroamphetamine (ADDERALL XR) 20 MG 24 hr capsule  amphetamine-dextroamphetamine (ADDERALL XR) 20 MG 24 hr capsule   amphetamine-dextroamphetamine (ADDERALL XR) 20 MG 24 hr capsule   Anxiety and depression    Chronic stable, continue zoloft 100mg       Left wrist pain    H/o fracture. Pending xr wrist. Advised ice, voltaren gel otc. 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 She will let me know how she is doing      Relevant Orders   DG Wrist Complete Left    -we discussed possible  serious and likely etiologies, options for evaluation and workup, limitations of telemedicine visit vs in person visit, treatment, treatment risks and precautions. Pt prefers to treat via telemedicine empirically rather then risking or undertaking an in person visit at this moment.  .   I discussed the assessment and treatment plan with the patient. The patient was provided an opportunity to ask questions and all were answered. The patient agreed with the plan and demonstrated an understanding of the instructions.   The patient was advised to call back or seek an in-person evaluation if the symptoms worsen or if the condition fails to improve as anticipated.   , FNP

## 2022-01-07 ENCOUNTER — Other Ambulatory Visit: Payer: Self-pay | Admitting: Family

## 2022-01-07 DIAGNOSIS — F32A Depression, unspecified: Secondary | ICD-10-CM

## 2022-02-07 ENCOUNTER — Encounter: Payer: Self-pay | Admitting: Family

## 2022-02-08 ENCOUNTER — Other Ambulatory Visit: Payer: Self-pay | Admitting: Family

## 2022-02-08 DIAGNOSIS — F909 Attention-deficit hyperactivity disorder, unspecified type: Secondary | ICD-10-CM

## 2022-02-08 MED ORDER — AMPHETAMINE-DEXTROAMPHET ER 20 MG PO CP24: 20.0000 mg | ORAL_CAPSULE | ORAL | 0 refills | Status: DC

## 2022-02-08 NOTE — Progress Notes (Signed)
I looked up patient on [place] Controlled Substances Reporting System PMP AWARE and saw no activity that raised concern of inappropriate use.   

## 2022-02-22 ENCOUNTER — Ambulatory Visit: Payer: No Typology Code available for payment source | Admitting: Family

## 2022-02-22 ENCOUNTER — Encounter: Payer: Self-pay | Admitting: Family

## 2022-02-22 ENCOUNTER — Other Ambulatory Visit: Payer: Self-pay

## 2022-02-22 VITALS — BP 128/78 | HR 101 | Temp 97.0°F | Ht 66.0 in | Wt 193.8 lb

## 2022-02-22 DIAGNOSIS — F909 Attention-deficit hyperactivity disorder, unspecified type: Secondary | ICD-10-CM

## 2022-02-22 DIAGNOSIS — E119 Type 2 diabetes mellitus without complications: Secondary | ICD-10-CM

## 2022-02-22 DIAGNOSIS — Z794 Long term (current) use of insulin: Secondary | ICD-10-CM

## 2022-02-22 DIAGNOSIS — F32A Depression, unspecified: Secondary | ICD-10-CM

## 2022-02-22 DIAGNOSIS — F419 Anxiety disorder, unspecified: Secondary | ICD-10-CM | POA: Diagnosis not present

## 2022-02-22 DIAGNOSIS — I10 Essential (primary) hypertension: Secondary | ICD-10-CM | POA: Diagnosis not present

## 2022-02-22 LAB — POCT GLYCOSYLATED HEMOGLOBIN (HGB A1C): Hemoglobin A1C: 9.4 % — AB (ref 4.0–5.6)

## 2022-02-22 MED ORDER — AMPHETAMINE-DEXTROAMPHET ER 20 MG PO CP24: 20.0000 mg | ORAL_CAPSULE | ORAL | 0 refills | Status: DC

## 2022-02-22 NOTE — Assessment & Plan Note (Addendum)
Lab Results  ?Component Value Date  ? HGBA1C 9.4 (A) 02/22/2022  ? ?Uncontrolled.  Advised to increase Ozempic to 0.5 mg in 2 more weeks after 4 weeks of ozempic 0.25.  From there advised we would plan increase in another  4 weeks.  Previous GI intolerability to metformin however she is willing to increase metformin from 250 mg to 500 mg and if tolerated I advised her to increase to metformin 1000 mg/day.  Continue glargine 50 units but as we escalate non insulin regimen, we will need to decrease insulin. Patient will start using CGM and send me a mychart in 3 weeks time with blood sugars readings or I can download data to better determine if we can decrease glargine by 10-20% to avoid hypoglycemia. Close  follow-up. ?

## 2022-02-22 NOTE — Assessment & Plan Note (Signed)
Chronic, stable.  Continue amlodipine 10 mg, losartan 100 mg 

## 2022-02-22 NOTE — Patient Instructions (Addendum)
After 4 weeks on ozempic 0.25mg , increase to 1mg .  Four weeks after this, we will increase dose again.  Please send me a MyChart message so that I may continue to increase Ozempic until we obtain diabetic control ? ?Increase metformin to 500mg . If you can tolerate, increase metformin to 1000mg  per day.  ? ?I have printed Adderall prescription in hopes that you are able to find it at a local pharmacy. ? ?Nice to see you! ? ? ? ? ? ? ?

## 2022-02-22 NOTE — Assessment & Plan Note (Signed)
Chronic, stable.  Reiterated the importance of her following up with me every 3 months for controlled substance.  She very much understands this.  I have given her a printed prescription of Adderall 20 mg due to the backorder at her pharmacy.  She will try another pharmacy and let me know if she is able to fill. ?

## 2022-02-22 NOTE — Progress Notes (Signed)
? ?Subjective:  ? ? Patient ID: Carol ReedyKimberly M Siegert, female    DOB: 01/13/1981, 41 y.o.   MRN: [id number] ? ?CC: Carol ReedyKimberly M Vig is a 41 y.o. female who presents today for follow up.  ? ?HPI: Feels well today.  No new complaints. ? ?ADHD-current Adderall 20 mg is working well for her.  Unfortunately, she hasnt taken adderall 20mg  for one week due to backorder. She would like refill printed today.  ? ?DM- compliant glargine 50 units qpm,  metformin 250 mg qpm, Ozempic 1 mg. She missed 2 weeks of ozempic, she is currently on 0.25mg . She has 1mg  ozempic pen at home. No hypoglycemic episodes.  ?FBG 160's . She is not using libre cgm. No sudden vision changes.  ? ?HTN-compliant with amlodipine 10 mg, losartan 100 mg. No cp.  ? ?Anxiety and depression-feels well on  Zoloft 100 mg ? ?HISTORY:  ?Past Medical History:  ?Diagnosis Date  ? Attention deficit disorder (ADD)   ? Diabetes mellitus without complication (HCC) 2009  ? Hypertension   ? Subglottic stenosis   ? Trach collar  ? ?Past Surgical History:  ?Procedure Laterality Date  ? BREAST BIOPSY Right   ? benign  ? BREAST BIOPSY Left 10/2017  ? fibroadeoma  ? BREAST BIOPSY Left 10/2017  ? fibroadenoma  ? TRACHEOSTOMY  2010  ? ?Family History  ?Problem Relation Age of Onset  ? Diabetes Father   ? Hypertension Father   ? Hypertension Brother   ? Colon cancer Maternal Aunt 50  ? Lung cancer Maternal Aunt   ? Breast cancer Neg Hx   ? Thyroid cancer Neg Hx   ? ? ?Allergies: Lac bovis ?Current Outpatient Medications on File Prior to Visit  ?Medication Sig Dispense Refill  ? amLODipine (NORVASC) 10 MG tablet TAKE 1 TABLET BY MOUTH EVERY DAY 90 tablet 2  ? amphetamine-dextroamphetamine (ADDERALL XR) 20 MG 24 hr capsule Take 1 capsule (20 mg total) by mouth daily. 30 capsule 0  ? amphetamine-dextroamphetamine (ADDERALL XR) 20 MG 24 hr capsule Take 1 capsule (20 mg total) by mouth every morning. 30 capsule 0  ? Continuous Blood Gluc Receiver (FREESTYLE LIBRE 2 READER) DEVI 1 Device  by Does not apply route daily. 1 each 0  ? Continuous Blood Gluc Sensor (FREESTYLE LIBRE 2 SENSOR) MISC 2 Devices by Does not apply route every 14 (fourteen) days. 2 each 4  ? etonogestrel (NEXPLANON) 68 MG IMPL implant 1 each by Subdermal route once.    ? glucose blood test strip Use as instructed up to 2 times per day 100 each 12  ? Insulin Glargine (BASAGLAR KWIKPEN) 100 UNIT/ML INJECT 0.5 MLS (50 UNITS   TOTAL) INTO THE SKIN DAILY AT 10PM 45 mL 1  ? losartan (COZAAR) 100 MG tablet Take 1 tablet (100 mg total) by mouth daily. 90 tablet 3  ? metFORMIN (GLUCOPHAGE XR) 500 MG 24 hr tablet Take 1 tablet (500 mg total) by mouth every evening. 90 tablet 1  ? rosuvastatin (CRESTOR) 40 MG tablet TAKE 1 TABLET BY MOUTH EVERY DAY 90 tablet 3  ? Semaglutide, 1 MG/DOSE, (OZEMPIC, 1 MG/DOSE,) 2 MG/1.5ML SOPN Inject 1 mg into the skin once a week. 3 mL 2  ? sertraline (ZOLOFT) 100 MG tablet TAKE 1 TABLET BY MOUTH EVERYDAY AT BEDTIME 90 tablet 1  ? ?No current facility-administered medications on file prior to visit.  ? ? ?Social History  ? ?Tobacco Use  ? Smoking status: Former  ?  Packs/day: 0.25  ?  Years: 3.00  ?  Pack years: 0.75  ?  Types: Cigarettes  ?  Quit date: 02/17/2002  ?  Years since quitting: 20.0  ? Smokeless tobacco: Never  ?Substance Use Topics  ? Alcohol use: Yes  ?  Alcohol/week: 0.0 standard drinks  ?  Comment: Rare - 2  year  ? Drug use: No  ? ? ?Review of Systems  ?Constitutional:  Negative for chills and fever.  ?Respiratory:  Negative for cough.   ?Cardiovascular:  Negative for chest pain and palpitations.  ?Gastrointestinal:  Negative for nausea and vomiting.  ?Psychiatric/Behavioral:  The patient is not nervous/anxious.   ?   ?Objective:  ?  ?BP 128/78 (BP Location: Left Arm, Patient Position: Sitting, Cuff Size: Normal)   Pulse (!) 101   Temp (!) 97 ?F (36.1 ?C) (Temporal)   Ht 5\' 6"  (1.676 m)   Wt 193 lb 12.8 oz (87.9 kg)   LMP 02/10/2022 (Exact Date)   SpO2 97%   BMI 31.28 kg/m?  ?BP Readings  from Last 3 Encounters:  ?02/22/22 128/78  ?10/26/21 135/90  ?07/08/21 130/86  ? ?Wt Readings from Last 3 Encounters:  ?02/22/22 193 lb 12.8 oz (87.9 kg)  ?10/26/21 195 lb (88.5 kg)  ?07/08/21 193 lb (87.5 kg)  ? ? ?Physical Exam ?Vitals reviewed.  ?Constitutional:   ?   Appearance: She is well-developed.  ?Eyes:  ?   Conjunctiva/sclera: Conjunctivae normal.  ?Cardiovascular:  ?   Rate and Rhythm: Normal rate and regular rhythm.  ?   Pulses: Normal pulses.  ?   Heart sounds: Normal heart sounds.  ?Pulmonary:  ?   Effort: Pulmonary effort is normal.  ?   Breath sounds: Normal breath sounds. No wheezing, rhonchi or rales.  ?Skin: ?   General: Skin is warm and dry.  ?Neurological:  ?   Mental Status: She is alert.  ?Psychiatric:     ?   Speech: Speech normal.     ?   Behavior: Behavior normal.     ?   Thought Content: Thought content normal.  ? ? ?   ?Assessment & Plan:  ? ?Problem List Items Addressed This Visit   ? ?  ? Cardiovascular and Mediastinum  ? Benign essential HTN  ?  Chronic, stable.  Continue amlodipine 10 mg, losartan 100 mg ?  ?  ?  ? Endocrine  ? DM type 2 (diabetes mellitus, type 2) (HCC) - Primary  ?  Lab Results  ?Component Value Date  ? HGBA1C 9.4 (A) 02/22/2022  ?Uncontrolled.  Advised to increase Ozempic to 0.5 mg in 2 more weeks after 4 weeks of ozempic 0.25.  From there advised we would plan increase in another  4 weeks.  Previous GI intolerability to metformin however she is willing to increase metformin from 250 mg to 500 mg and if tolerated I advised her to increase to metformin 1000 mg/day.  Continue glargine 50 units but as we escalate non insulin regimen, we will need to decrease insulin. Patient will start using CGM and send me a mychart in 3 weeks time with blood sugars readings or I can download data to better determine if we can decrease glargine by 10-20% to avoid hypoglycemia. Close  follow-up. ?  ?  ? Relevant Medications  ? amphetamine-dextroamphetamine (ADDERALL XR) 20 MG 24 hr  capsule  ? Other Relevant Orders  ? POCT HgB A1C (Completed)  ? CBC with Differential/Platelet  ? Comprehensive metabolic panel  ? Lipid panel  ?  Microalbumin / creatinine urine ratio  ? VITAMIN D 25 Hydroxy (Vit-D Deficiency, Fractures)  ? TSH  ? Hepatitis C antibody  ?  ? Other  ? ADHD (attention deficit hyperactivity disorder)  ?  Chronic, stable.  Reiterated the importance of her following up with me every 3 months for controlled substance.  She very much understands this.  I have given her a printed prescription of Adderall 20 mg due to the backorder at her pharmacy.  She will try another pharmacy and let me know if she is able to fill. ?  ?  ? Relevant Medications  ? amphetamine-dextroamphetamine (ADDERALL XR) 20 MG 24 hr capsule  ? Anxiety and depression  ?  Chronic, stable.  Continue Zoloft 100 mg ?  ?  ? ? ? ?I have discontinued Joelene Millin. Binz's ferrous sulfate. I am also having her maintain her glucose blood, Nexplanon, FreeStyle Libre 2 Reader, FreeStyle Libre 2 Sensor, metFORMIN, losartan, Basaglar KwikPen, amLODipine, amphetamine-dextroamphetamine, Ozempic (1 MG/DOSE), rosuvastatin, sertraline, amphetamine-dextroamphetamine, and amphetamine-dextroamphetamine. ? ? ?Meds ordered this encounter  ?Medications  ? amphetamine-dextroamphetamine (ADDERALL XR) 20 MG 24 hr capsule  ?  Sig: Take 1 capsule (20 mg total) by mouth every morning.  ?  Dispense:  30 capsule  ?  Refill:  0  ?  Order Specific Question:   Supervising Provider  ?  Answer:   Sherlene Shams [[id number]]  ? ? ?Return precautions given.  ? ?Risks, benefits, and alternatives of the medications and treatment plan prescribed today were discussed, and patient expressed understanding.  ? ?Education regarding symptom management and diagnosis given to patient on AVS. ? ?Continue to follow with Allegra Grana, FNP for routine health maintenance.  ? ?Carol [name] and I agreed with plan.  ? ?Rennie Plowman, FNP ? ? ?

## 2022-02-22 NOTE — Assessment & Plan Note (Signed)
Chronic, stable.  Continue Zoloft 100 mg ?

## 2022-05-23 ENCOUNTER — Encounter: Payer: Self-pay | Admitting: Family

## 2022-05-29 ENCOUNTER — Other Ambulatory Visit: Payer: Self-pay | Admitting: Family

## 2022-05-29 DIAGNOSIS — E1100 Type 2 diabetes mellitus with hyperosmolarity without nonketotic hyperglycemic-hyperosmolar coma (NKHHC): Secondary | ICD-10-CM

## 2022-05-30 ENCOUNTER — Ambulatory Visit: Payer: No Typology Code available for payment source | Admitting: Family

## 2022-06-01 ENCOUNTER — Ambulatory Visit: Payer: No Typology Code available for payment source | Admitting: Family

## 2022-06-14 ENCOUNTER — Other Ambulatory Visit: Payer: Self-pay | Admitting: Family

## 2022-06-14 DIAGNOSIS — F909 Attention-deficit hyperactivity disorder, unspecified type: Secondary | ICD-10-CM

## 2022-07-03 NOTE — Telephone Encounter (Signed)
Called  to inform patient that she needs to schedule appt to discuss medications but was unable to leave a VM.

## 2022-10-09 ENCOUNTER — Encounter: Payer: Self-pay | Admitting: Family

## 2022-10-11 ENCOUNTER — Other Ambulatory Visit: Payer: Self-pay

## 2022-10-13 ENCOUNTER — Encounter: Payer: Self-pay | Admitting: Family

## 2022-10-13 ENCOUNTER — Ambulatory Visit: Payer: No Typology Code available for payment source | Admitting: Family

## 2022-10-13 ENCOUNTER — Other Ambulatory Visit: Payer: Self-pay

## 2022-10-13 VITALS — BP 126/78 | HR 99 | Temp 97.8°F | Ht 64.0 in | Wt 179.2 lb

## 2022-10-13 DIAGNOSIS — E1100 Type 2 diabetes mellitus with hyperosmolarity without nonketotic hyperglycemic-hyperosmolar coma (NKHHC): Secondary | ICD-10-CM | POA: Diagnosis not present

## 2022-10-13 DIAGNOSIS — I10 Essential (primary) hypertension: Secondary | ICD-10-CM

## 2022-10-13 DIAGNOSIS — Z794 Long term (current) use of insulin: Secondary | ICD-10-CM

## 2022-10-13 DIAGNOSIS — F909 Attention-deficit hyperactivity disorder, unspecified type: Secondary | ICD-10-CM | POA: Diagnosis not present

## 2022-10-13 DIAGNOSIS — E559 Vitamin D deficiency, unspecified: Secondary | ICD-10-CM

## 2022-10-13 DIAGNOSIS — Z1231 Encounter for screening mammogram for malignant neoplasm of breast: Secondary | ICD-10-CM

## 2022-10-13 LAB — COMPREHENSIVE METABOLIC PANEL
ALT: 8 U/L (ref 0–35)
AST: 11 U/L (ref 0–37)
Albumin: 4.1 g/dL (ref 3.5–5.2)
Alkaline Phosphatase: 63 U/L (ref 39–117)
BUN: 15 mg/dL (ref 6–23)
CO2: 30 mEq/L (ref 19–32)
Calcium: 9.4 mg/dL (ref 8.4–10.5)
Chloride: 101 mEq/L (ref 96–112)
Creatinine, Ser: 0.75 mg/dL (ref 0.40–1.20)
GFR: 97.61 mL/min (ref >60.00)
Glucose, Bld: 120 mg/dL — ABNORMAL HIGH (ref 70–99)
Potassium: 4 mEq/L (ref 3.5–5.1)
Sodium: 137 mEq/L (ref 135–145)
Total Bilirubin: 0.4 mg/dL (ref 0.2–1.2)
Total Protein: 7.5 g/dL (ref 6.0–8.3)

## 2022-10-13 LAB — MICROALBUMIN / CREATININE URINE RATIO
Creatinine,U: 581.8 mg/dL
Microalb Creat Ratio: 0.4 mg/g (ref 0.0–30.0)
Microalb, Ur: 2.4 mg/dL — ABNORMAL HIGH (ref 0.0–1.9)

## 2022-10-13 LAB — CBC WITH DIFFERENTIAL/PLATELET
Basophils Absolute: 0.1 10*3/uL (ref 0.0–0.1)
Basophils Relative: 1.1 % (ref 0.0–3.0)
Eosinophils Absolute: 0.1 10*3/uL (ref 0.0–0.7)
Eosinophils Relative: 1.9 % (ref 0.0–5.0)
HCT: 37.3 % (ref 36.0–46.0)
Hemoglobin: 12.1 g/dL (ref 12.0–15.0)
Lymphocytes Relative: 30.6 % (ref 12.0–46.0)
Lymphs Abs: 1.4 10*3/uL (ref 0.7–4.0)
MCHC: 32.4 g/dL (ref 30.0–36.0)
MCV: 82.3 fl (ref 78.0–100.0)
Monocytes Absolute: 0.4 10*3/uL (ref 0.1–1.0)
Monocytes Relative: 9.1 % (ref 3.0–12.0)
Neutro Abs: 2.6 10*3/uL (ref 1.4–7.7)
Neutrophils Relative %: 57.3 % (ref 43.0–77.0)
Platelets: 348 10*3/uL (ref 150.0–400.0)
RBC: 4.54 Mil/uL (ref 3.87–5.11)
RDW: 13.6 % (ref 11.5–15.5)
WBC: 4.5 10*3/uL (ref 4.0–10.5)

## 2022-10-13 LAB — LIPID PANEL
Cholesterol: 195 mg/dL (ref 0–200)
HDL: 48.9 mg/dL (ref >39.00)
LDL Cholesterol: 135 mg/dL — ABNORMAL HIGH (ref 0–99)
NonHDL: 146.22
Total CHOL/HDL Ratio: 4
Triglycerides: 55 mg/dL (ref 0.0–149.0)
VLDL: 11 mg/dL (ref 0.0–40.0)

## 2022-10-13 LAB — POCT GLYCOSYLATED HEMOGLOBIN (HGB A1C): Hemoglobin A1C: 6.8 % — AB (ref 4.0–5.6)

## 2022-10-13 LAB — TSH: TSH: 1.74 u[IU]/mL (ref 0.35–5.50)

## 2022-10-13 LAB — VITAMIN D 25 HYDROXY (VIT D DEFICIENCY, FRACTURES): VITD: 12.97 ng/mL — ABNORMAL LOW (ref 30.00–100.00)

## 2022-10-13 MED ORDER — LOSARTAN POTASSIUM 100 MG PO TABS: 100.0000 mg | ORAL_TABLET | Freq: Every day | ORAL | 3 refills | Status: DC

## 2022-10-13 MED ORDER — AMPHETAMINE-DEXTROAMPHET ER 20 MG PO CP24: 20.0000 mg | ORAL_CAPSULE | Freq: Every day | ORAL | 0 refills | Status: DC

## 2022-10-13 MED ORDER — SEMAGLUTIDE (2 MG/DOSE) 8 MG/3ML ~~LOC~~ SOPN: 2.0000 mg | PEN_INJECTOR | SUBCUTANEOUS | 3 refills | Status: DC

## 2022-10-13 MED ORDER — BASAGLAR KWIKPEN 100 UNIT/ML ~~LOC~~ SOPN: 20.0000 [IU] | PEN_INJECTOR | Freq: Every morning | SUBCUTANEOUS | 1 refills | Status: DC

## 2022-10-13 MED ORDER — AMPHETAMINE-DEXTROAMPHET ER 20 MG PO CP24: 20.0000 mg | ORAL_CAPSULE | ORAL | 0 refills | Status: DC

## 2022-10-13 NOTE — Progress Notes (Signed)
Subjective:    Patient ID: Carol [name], female    DOB: 06-21-1981, 41 y.o.   MRN: [id number]  CC: Carol [name] is a 41 y.o. female who presents today for follow up.   HPI: Feels well today No complaints     Diabetes-compliant metformin 500 mg daily, glargine 40 units, Ozempic 1 mg. FBG 115, 120 She is working out 5 days per week and lost 20 lbs Denies constipation, nausea Denies hypoglycemic episodes  Hypertension-compliant with amlodipine 10 mg, losartan 100 mg   ADHD-compliant with Adderall 20 mg daily which is working well for her. She is compliant with Zoloft 100 mg.  She feels anxiety and depression are well controlled.  HISTORY:  Past Medical History:  Diagnosis Date  . Attention deficit disorder (ADD)   . Diabetes mellitus without complication (HCC) 2009  . Hypertension   . Subglottic stenosis    Trach collar   Past Surgical History:  Procedure Laterality Date  . BREAST BIOPSY Right    benign  . BREAST BIOPSY Left 10/2017   fibroadeoma  . BREAST BIOPSY Left 10/2017   fibroadenoma  . TRACHEOSTOMY  2010   Family History  Problem Relation Age of Onset  . Diabetes Father   . Hypertension Father   . Hypertension Brother   . Colon cancer Maternal Aunt 50  . Lung cancer Maternal Aunt   . Breast cancer Neg Hx   . Thyroid cancer Neg Hx     Allergies: Milk (cow) Current Outpatient Medications on File Prior to Visit  Medication Sig Dispense Refill  . amLODipine (NORVASC) 10 MG tablet TAKE 1 TABLET BY MOUTH EVERY DAY 90 tablet 2  . Continuous Blood Gluc Receiver (FREESTYLE LIBRE 2 READER) DEVI 1 Device by Does not apply route daily. 1 each 0  . Continuous Blood Gluc Sensor (FREESTYLE LIBRE 2 SENSOR) MISC 2 Devices by Does not apply route every 14 (fourteen) days. 2 each 4  . etonogestrel (NEXPLANON) 68 MG IMPL implant 1 each by Subdermal route once.    Marland Kitchen glucose blood test strip Use as instructed up to 2 times per day 100 each 12  . metFORMIN  (GLUCOPHAGE XR) 500 MG 24 hr tablet Take 1 tablet (500 mg total) by mouth every evening. 90 tablet 1  . rosuvastatin (CRESTOR) 40 MG tablet TAKE 1 TABLET BY MOUTH EVERY DAY 90 tablet 3  . sertraline (ZOLOFT) 100 MG tablet TAKE 1 TABLET BY MOUTH EVERYDAY AT BEDTIME 90 tablet 1   No current facility-administered medications on file prior to visit.    Social History   Tobacco Use  . Smoking status: Former    Packs/day: 0.25    Years: 3.00    Total pack years: 0.75    Types: Cigarettes    Quit date: 02/17/2002    Years since quitting: 20.6  . Smokeless tobacco: Never  Substance Use Topics  . Alcohol use: Yes    Alcohol/week: 0.0 standard drinks of alcohol    Comment: Rare - 2  year  . Drug use: No    Review of Systems  Constitutional:  Negative for chills and fever.  Respiratory:  Negative for cough.   Cardiovascular:  Negative for chest pain and palpitations.  Gastrointestinal:  Negative for nausea and vomiting.      Objective:    BP 126/78 (BP Location: Left Arm, Patient Position: Sitting, Cuff Size: Normal)   Pulse 99   Temp 97.8 F (36.6 C) (Oral)   Ht 5'  4" (1.626 m)   Wt 179 lb 3.2 oz (81.3 kg)   LMP  (LMP Unknown)   SpO2 97%   BMI 30.76 kg/m  BP Readings from Last 3 Encounters:  10/13/22 126/78  02/22/22 128/78  10/26/21 135/90   Wt Readings from Last 3 Encounters:  10/13/22 179 lb 3.2 oz (81.3 kg)  02/22/22 193 lb 12.8 oz (87.9 kg)  10/26/21 195 lb (88.5 kg)    Physical Exam Vitals reviewed.  Constitutional:      Appearance: She is well-developed.  Eyes:     Conjunctiva/sclera: Conjunctivae normal.  Cardiovascular:     Rate and Rhythm: Normal rate and regular rhythm.     Pulses: Normal pulses.     Heart sounds: Normal heart sounds.  Pulmonary:     Effort: Pulmonary effort is normal.     Breath sounds: Normal breath sounds. No wheezing, rhonchi or rales.  Skin:    General: Skin is warm and dry.  Neurological:     Mental Status: She is alert.   Psychiatric:        Speech: Speech normal.        Behavior: Behavior normal.        Thought Content: Thought content normal.       Assessment & Plan:   Problem List Items Addressed This Visit       Cardiovascular and Mediastinum   Benign essential HTN    Chronic, stable.  Continue amlodipine10 mg, losartan 100mg       Relevant Medications   losartan (COZAAR) 100 MG tablet     Endocrine   DM type 2 (diabetes mellitus, type 2) (HCC)    Lab Results  Component Value Date   HGBA1C 6.8 (A) 10/13/2022  Significant improvement and congratulated patient on her hard work.  We have opted to increase to Ozempic 2 mg and continue metformin 500 mg daily.  Decrease glargine from 40 units to 20 units.  We will discuss increasing metformin and further increasing metformin at follow-up.  She will let me know how she is doing      Relevant Medications   Semaglutide, 2 MG/DOSE, 8 MG/3ML SOPN   amphetamine-dextroamphetamine (ADDERALL XR) 20 MG 24 hr capsule   losartan (COZAAR) 100 MG tablet   Insulin Glargine (BASAGLAR KWIKPEN) 100 UNIT/ML   Other Relevant Orders   POCT HgB A1C (Completed)   Microalbumin / creatinine urine ratio   Comprehensive metabolic panel   CBC with Differential/Platelet   Lipid panel   TSH     Other   ADHD (attention deficit hyperactivity disorder)    Chronic, stable.  Continue Adderall 20 mg XR.  Refills provided today I looked up patient on [place] Controlled Substances Reporting System PMP AWARE and saw no activity that raised concern of inappropriate use.        Relevant Medications   amphetamine-dextroamphetamine (ADDERALL XR) 20 MG 24 hr capsule   amphetamine-dextroamphetamine (ADDERALL XR) 20 MG 24 hr capsule   amphetamine-dextroamphetamine (ADDERALL XR) 20 MG 24 hr capsule   Other Visit Diagnoses     Encounter for screening mammogram for malignant neoplasm of breast    -  Primary   Relevant Orders   MM 3D SCREEN BREAST BILATERAL   Vitamin D deficiency        Relevant Orders   VITAMIN D 25 Hydroxy (Vit-D Deficiency, Fractures)        I have discontinued 10/15/2022. Eggert's Ozempic (1 MG/DOSE). I have also changed her Basaglar KwikPen. Additionally,  I am having her start on Semaglutide (2 MG/DOSE). Lastly, I am having her maintain her glucose blood, Nexplanon, FreeStyle Libre 2 Reader, FreeStyle Libre 2 Sensor, metFORMIN, amLODipine, rosuvastatin, sertraline, amphetamine-dextroamphetamine, amphetamine-dextroamphetamine, amphetamine-dextroamphetamine, and losartan.   Meds ordered this encounter  Medications  . Semaglutide, 2 MG/DOSE, 8 MG/3ML SOPN    Sig: Inject 2 mg as directed once a week.    Dispense:  3 mL    Refill:  3    Order Specific Question:   Supervising Provider    Answer:   Derrel Nip, TERESA L [[id number]]  . amphetamine-dextroamphetamine (ADDERALL XR) 20 MG 24 hr capsule    Sig: Take 1 capsule (20 mg total) by mouth daily.    Dispense:  30 capsule    Refill:  0    DNF 12/13/22    Order Specific Question:   Supervising Provider    Answer:   Deborra Medina L [[id number]]  . amphetamine-dextroamphetamine (ADDERALL XR) 20 MG 24 hr capsule    Sig: Take 1 capsule (20 mg total) by mouth every morning.    Dispense:  30 capsule    Refill:  0    DNF 11/13/22    Order Specific Question:   Supervising Provider    Answer:   Deborra Medina L [[id number]]  . amphetamine-dextroamphetamine (ADDERALL XR) 20 MG 24 hr capsule    Sig: Take 1 capsule (20 mg total) by mouth every morning.    Dispense:  30 capsule    Refill:  0    Order Specific Question:   Supervising Provider    Answer:   Deborra Medina L [[id number]]  . losartan (COZAAR) 100 MG tablet    Sig: Take 1 tablet (100 mg total) by mouth daily.    Dispense:  90 tablet    Refill:  3    Order Specific Question:   Supervising Provider    Answer:   Deborra Medina L [[id number]]  . Insulin Glargine (BASAGLAR KWIKPEN) 100 UNIT/ML    Sig: Inject 20 Units into the skin every morning.    Dispense:  45 mL    Refill:   1    Order Specific Question:   Supervising Provider    Answer:   Crecencio Mc [[id number]]    Return precautions given.   Risks, benefits, and alternatives of the medications and treatment plan prescribed today were discussed, and patient expressed understanding.   Education regarding symptom management and diagnosis given to patient on AVS.  Continue to follow with Burnard Hawthorne, FNP for routine health maintenance.   Rosezena Sensor and I agreed with plan.   Mable Paris, FNP

## 2022-10-13 NOTE — Assessment & Plan Note (Signed)
Chronic, stable.  Continue amlodipine 10 mg, losartan 100 mg 

## 2022-10-13 NOTE — Assessment & Plan Note (Signed)
Chronic, stable.  Continue Adderall 20 mg XR.  Refills provided today I looked up patient on [place] Controlled Substances Reporting System PMP AWARE and saw no activity that raised concern of inappropriate use.   

## 2022-10-13 NOTE — Assessment & Plan Note (Signed)
Lab Results  Component Value Date   HGBA1C 6.8 (A) 10/13/2022   Significant improvement and congratulated patient on her hard work.  We have opted to increase to Ozempic 2 mg and continue metformin 500 mg daily.  Decrease glargine from 40 units to 20 units.  We will discuss increasing metformin and further increasing metformin at follow-up.  She will let me know how she is doing

## 2022-10-13 NOTE — Patient Instructions (Addendum)
Please call  and schedule your 3D mammogram and /or bone density scan as we discussed.   [place]  ( new location in 2023)  [address], [place], [place] [address]  [place], [place]  Decrease glargine from 40 units to 20 units  We may have to adjust from there, certainly blood pressures are approaching 120 fasting, I think is reasonable to decrease to 15 units or 10 units.  At follow-up we will talk about increasing metformin.  I have increased Ozempic from 1 mg to 2 mg.  You may start this Sunday.

## 2022-10-18 ENCOUNTER — Encounter: Payer: Self-pay | Admitting: Family

## 2022-10-18 ENCOUNTER — Other Ambulatory Visit: Payer: Self-pay

## 2022-10-18 ENCOUNTER — Other Ambulatory Visit: Payer: Self-pay | Admitting: Family

## 2022-10-18 DIAGNOSIS — E559 Vitamin D deficiency, unspecified: Secondary | ICD-10-CM

## 2022-10-18 DIAGNOSIS — Z794 Long term (current) use of insulin: Secondary | ICD-10-CM

## 2022-10-18 MED ORDER — EZETIMIBE 10 MG PO TABS: 10.0000 mg | ORAL_TABLET | Freq: Every day | ORAL | 1 refills | Status: DC

## 2022-10-18 MED ORDER — CHOLECALCIFEROL 1.25 MG (50000 UT) PO TABS
ORAL_TABLET | ORAL | 0 refills | Status: DC
Start: 2022-10-18 — End: 2022-11-22

## 2022-10-20 ENCOUNTER — Other Ambulatory Visit: Payer: Self-pay

## 2022-10-20 DIAGNOSIS — Z538 Procedure and treatment not carried out for other reasons: Secondary | ICD-10-CM

## 2022-10-20 NOTE — Telephone Encounter (Signed)
Referral sent pt is aware

## 2022-11-06 ENCOUNTER — Other Ambulatory Visit: Payer: Self-pay | Admitting: Family

## 2022-11-06 DIAGNOSIS — E119 Type 2 diabetes mellitus without complications: Secondary | ICD-10-CM

## 2022-11-06 MED ORDER — SEMAGLUTIDE (2 MG/DOSE) 8 MG/3ML ~~LOC~~ SOPN: 2.0000 mg | PEN_INJECTOR | SUBCUTANEOUS | 3 refills | Status: DC

## 2022-11-17 ENCOUNTER — Other Ambulatory Visit: Payer: Self-pay | Admitting: Family

## 2022-11-17 DIAGNOSIS — E119 Type 2 diabetes mellitus without complications: Secondary | ICD-10-CM

## 2022-11-18 ENCOUNTER — Other Ambulatory Visit: Payer: Self-pay | Admitting: Family

## 2022-11-18 DIAGNOSIS — E559 Vitamin D deficiency, unspecified: Secondary | ICD-10-CM

## 2022-11-22 ENCOUNTER — Other Ambulatory Visit: Payer: Self-pay | Admitting: Family

## 2022-11-22 DIAGNOSIS — E559 Vitamin D deficiency, unspecified: Secondary | ICD-10-CM

## 2022-11-22 MED ORDER — CHOLECALCIFEROL 1.25 MG (50000 UT) PO TABS: ORAL_TABLET | ORAL | 0 refills | Status: DC

## 2022-11-22 NOTE — Telephone Encounter (Signed)
Sig states for 8 weeks but ordered 12 week supply with 1 refill clarify 12 week or 8 week as to refill?

## 2022-12-24 ENCOUNTER — Other Ambulatory Visit: Payer: Self-pay | Admitting: Family

## 2022-12-24 DIAGNOSIS — E559 Vitamin D deficiency, unspecified: Secondary | ICD-10-CM

## 2023-01-12 ENCOUNTER — Encounter: Payer: Self-pay | Admitting: Pharmacist

## 2023-01-19 ENCOUNTER — Ambulatory Visit: Payer: No Typology Code available for payment source | Admitting: Family

## 2023-02-07 ENCOUNTER — Ambulatory Visit: Payer: No Typology Code available for payment source | Admitting: Family

## 2023-02-12 ENCOUNTER — Telehealth: Payer: No Typology Code available for payment source | Admitting: Family

## 2023-02-12 ENCOUNTER — Encounter: Payer: Self-pay | Admitting: Family

## 2023-02-12 DIAGNOSIS — E119 Type 2 diabetes mellitus without complications: Secondary | ICD-10-CM

## 2023-02-12 DIAGNOSIS — F909 Attention-deficit hyperactivity disorder, unspecified type: Secondary | ICD-10-CM

## 2023-02-12 DIAGNOSIS — Z794 Long term (current) use of insulin: Secondary | ICD-10-CM

## 2023-02-12 DIAGNOSIS — F419 Anxiety disorder, unspecified: Secondary | ICD-10-CM

## 2023-02-12 DIAGNOSIS — F32A Depression, unspecified: Secondary | ICD-10-CM | POA: Diagnosis not present

## 2023-02-12 MED ORDER — AMPHETAMINE-DEXTROAMPHET ER 20 MG PO CP24: 20.0000 mg | ORAL_CAPSULE | ORAL | 0 refills | Status: DC

## 2023-02-12 MED ORDER — SEMAGLUTIDE (1 MG/DOSE) 4 MG/3ML ~~LOC~~ SOPN: 1.0000 mg | PEN_INJECTOR | SUBCUTANEOUS | 2 refills | Status: DC

## 2023-02-12 MED ORDER — AMPHETAMINE-DEXTROAMPHET ER 20 MG PO CP24: 20.0000 mg | ORAL_CAPSULE | Freq: Every day | ORAL | 0 refills | Status: DC

## 2023-02-12 MED ORDER — FLUOXETINE HCL 40 MG PO CAPS: 40.0000 mg | ORAL_CAPSULE | Freq: Every day | ORAL | 3 refills | Status: DC

## 2023-02-12 NOTE — Assessment & Plan Note (Signed)
Worsened of late.  Discussed at length passive thoughts of not being here anymore.  Patient describes a strong support group and that would never harm herself.  Denies active suicidal thoughts.  We agreed to stop Zoloft '100mg'$  and start Prozac 40 mg . Referral to counseling.  She will let me know how she is doing.  Close follow-up.

## 2023-02-12 NOTE — Patient Instructions (Addendum)
Increase ozempic to '1mg'$  Start metformin '500mg'$  once daily  THEN STOP insulin glargine.  Please take daily fasting blood glucose.  Goal is between 80 and 120.    Stop Zoloft as of today.  Tonight will be your last dose.  Please start Prozac tomorrow morning.   I placed a referral to counseling. Let us know if you dont hear back within a week in regards to an appointment being scheduled.   So that you are aware, if you are [facility] MyChart user , please pay attention to your MyChart messages as you may receive a MyChart message with a phone number to call and schedule this test/appointment own your own from our referral coordinator. This is a new process so I do not want you to miss this message.  If you are not a MyChart user, you will receive a phone call.   Our hope is for gradual improvement of mood since starting medication; however this may take several weeks.   If you start to have unusual thoughts, thoughts of hurting yourself, or anyone else, please go immediately to the emergency department.   Follow up in 2 weeks.   Please text to [phone] and write the word 'home'. This will put you in touch with trained crisis counselor and resources.    National Suicide Prevention Hotline - available 24 hours a day, 7 days a week.  [phone] 732-4922  Major Depressive Disorder Major depressive disorder is a mental illness. It also may be called clinical depression or unipolar depression. Major depressive disorder usually causes feelings of sadness, hopelessness, or helplessness. Some people with this disorder do not feel particularly sad but lose interest in doing things they used to enjoy (anhedonia). Major depressive disorder also can cause physical symptoms. It can interfere with work, school, relationships, and other normal everyday activities. The disorder varies in severity but is longer lasting and more serious than the sadness we all feel from time to time in our lives. Major depressive disorder  often is triggered by stressful life events or major life changes. Examples of these triggers include divorce, loss of your job or home, a move, and the death of a family member or close friend. Sometimes this disorder occurs for no obvious reason at all. People who have family members with major depressive disorder or bipolar disorder are at higher risk for developing this disorder, with or without life stressors. Major depressive disorder can occur at any age. It may occur just once in your life (single episode major depressive disorder). It may occur multiple times (recurrent major depressive disorder). SYMPTOMS People with major depressive disorder have either anhedonia or depressed mood on nearly a daily basis for at least 2 weeks or longer. Symptoms of depressed mood include: Feelings of sadness (blue or down in the dumps) or emptiness. Feelings of hopelessness or helplessness. Tearfulness or episodes of crying (may be observed by others). Irritability (children and adolescents). In addition to depressed mood or anhedonia or both, people with this disorder have at least four of the following symptoms: Difficulty sleeping or sleeping too much.   Significant change (increase or decrease) in appetite or weight.   Lack of energy or motivation. Feelings of guilt and worthlessness.   Difficulty concentrating, remembering, or making decisions. Unusually slow movement (psychomotor retardation) or restlessness (as observed by others).   Recurrent wishes for death, recurrent thoughts of self-harm (suicide), or a suicide attempt. People with major depressive disorder commonly have persistent negative thoughts about themselves, other people,  and the world. People with severe major depressive disorder may experience distorted beliefs or perceptions about the world (psychotic delusions). They also may see or hear things that are not real (psychotic hallucinations). DIAGNOSIS Major depressive disorder is  diagnosed through an assessment by your health care provider. Your health care provider will ask about aspects of your daily life, such as mood, sleep, and appetite, to see if you have the diagnostic symptoms of major depressive disorder. Your health care provider may ask about your medical history and use of alcohol or drugs, including prescription medicines. Your health care provider also may do a physical exam and blood work. This is because certain medical conditions and the use of certain substances can cause major depressive disorder-like symptoms (secondary depression). Your health care provider also may refer you to a mental health specialist for further evaluation and treatment. TREATMENT It is important to recognize the symptoms of major depressive disorder and seek treatment. The following treatments can be prescribed for this disorder:   Medicine. Antidepressant medicines usually are prescribed. Antidepressant medicines are thought to correct chemical imbalances in the brain that are commonly associated with major depressive disorder. Other types of medicine may be added if the symptoms do not respond to antidepressant medicines alone or if psychotic delusions or hallucinations occur. Talk therapy. Talk therapy can be helpful in treating major depressive disorder by providing support, education, and guidance. Certain types of talk therapy also can help with negative thinking (cognitive behavioral therapy) and with relationship issues that trigger this disorder (interpersonal therapy). A mental health specialist can help determine which treatment is best for you. Most people with major depressive disorder do well with a combination of medicine and talk therapy. Treatments involving electrical stimulation of the brain can be used in situations with extremely severe symptoms or when medicine and talk therapy do not work over time. These treatments include electroconvulsive therapy, transcranial  magnetic stimulation, and vagal nerve stimulation.   This information is not intended to replace advice given to you by your health care provider. Make sure you discuss any questions you have with your health care provider.   Document Released: 03/31/2013 Document Revised: 12/25/2014 Document Reviewed: 03/31/2013 Elsevier Interactive Patient Education [employer].

## 2023-02-12 NOTE — Progress Notes (Signed)
Virtual Visit via Video Note  I connected with Carol [name] on 02/12/23 at  2:30 PM EST by a video enabled telemedicine application and verified that I am speaking with the correct person using two identifiers. Location patient: home Location provider: work  Persons participating in the virtual visit: patient, provider  I discussed the limitations of evaluation and management by telemedicine and the availability of in person appointments. The patient expressed understanding and agreed to proceed.  HPI: She feels depression has worsened.She is sleeping all the time.  She is more tearful. Endorses 3 to 4 weeks ago after incident at work, she did think about not being here anymore.  She denies any thoughts of harming herself or anyone else.  She denies suicidal plan.  No history of suicidal attempt.  She is close to her husband.  DM- she has been checking FBG 'low 100's'. She is not using Libre. She ran out of ozempic for period of time due to back order and started back over from 0.'25mg'$  , currently 0.'5mg'$  for 3 weeks. She is not taking metformin. She takes glargine 40 units prn if FBG > 120.   ADHD- feels well on adderal '20mg'$ . She feels dose is working well    ROS: See pertinent positives and negatives per HPI.  EXAM:  VITALS per patient if applicable: There were no vitals taken for this visit. BP Readings from Last 3 Encounters:  10/13/22 126/78  02/22/22 128/78  10/26/21 135/90   Wt Readings from Last 3 Encounters:  10/13/22 179 lb 3.2 oz (81.3 kg)  02/22/22 193 lb 12.8 oz (87.9 kg)  10/26/21 195 lb (88.5 kg)      02/12/2023    2:28 PM 10/13/2022    9:04 AM 02/22/2022   10:46 AM  Depression screen PHQ 2/9  Decreased Interest 1 0 0  Down, Depressed, Hopeless 1 0 0  PHQ - 2 Score 2 0 0  Altered sleeping 2  0  Tired, decreased energy 2  0  Change in appetite 0  0  Feeling bad or failure about yourself  1  0  Trouble concentrating 2  0  Moving slowly or fidgety/restless  0  0  Suicidal thoughts 0  0  PHQ-9 Score 9  0  Difficult doing work/chores Somewhat difficult  Not difficult at all     GENERAL: alert, oriented, appears well and in no acute distress  HEENT: atraumatic, conjunttiva clear, no obvious abnormalities on inspection of external nose and ears  NECK: normal movements of the head and neck  LUNGS: on inspection no signs of respiratory distress, breathing rate appears normal, no obvious gross SOB, gasping or wheezing  CV: no obvious cyanosis  MS: moves all visible extremities without noticeable abnormality  PSYCH/NEURO: pleasant and cooperative, no obvious depression or anxiety, speech and thought processing grossly intact  ASSESSMENT AND PLAN: Anxiety and depression Assessment & Plan: Worsened of late.  Discussed at length passive thoughts of not being here anymore.  Patient describes a strong support group and that would never harm herself.  Denies active suicidal thoughts.  We agreed to stop Zoloft '100mg'$  and start Prozac 40 mg . Referral to counseling.  She will let me know how she is doing.  Close follow-up.  Orders: -     FLUoxetine HCl; Take 1 capsule (40 mg total) by mouth daily.  Dispense: 90 capsule; Refill: 3 -     Ambulatory referral to Psychology  Attention deficit hyperactivity disorder (ADHD), unspecified ADHD  type Assessment & Plan: Chronic, stable.  Continue Adderall 20 mg XR.  Refills provided today I looked up patient on  Controlled Substances Reporting System PMP AWARE and saw no activity that raised concern of inappropriate use.    Orders: -     Amphetamine-Dextroamphet ER; Take 1 capsule (20 mg total) by mouth every morning.  Dispense: 30 capsule; Refill: 0 -     Amphetamine-Dextroamphet ER; Take 1 capsule (20 mg total) by mouth every morning.  Dispense: 30 capsule; Refill: 0 -     Amphetamine-Dextroamphet ER; Take 1 capsule (20 mg total) by mouth daily.  Dispense: 30 capsule; Refill: 0  Type 2 diabetes mellitus  without complication, with long-term current use of insulin (HCC) Assessment & Plan: Lab Results  Component Value Date   HGBA1C 6.8 (A) 10/13/2022   From FBG , anticipate similar A1c.  Advised to increase Ozempic to 1 mg, and start metformin 500 mg again.  Advised her to discontinue glargine altogether due to risk of hypoglycemia  Orders: -     Semaglutide (1 MG/DOSE); Inject 1 mg as directed once a week.  Dispense: 3 mL; Refill: 2     -we discussed possible serious and likely etiologies, options for evaluation and workup, limitations of telemedicine visit vs in person visit, treatment, treatment risks and precautions. Pt prefers to treat via telemedicine empirically rather then risking or undertaking an in person visit at this moment.    I discussed the assessment and treatment plan with the patient. The patient was provided an opportunity to ask questions and all were answered. The patient agreed with the plan and demonstrated an understanding of the instructions.   The patient was advised to call back or seek an in-person evaluation if the symptoms worsen or if the condition fails to improve as anticipated.  Advised if desired AVS can be mailed or viewed via [place] if [place] user.   Mable Paris, FNP

## 2023-02-12 NOTE — Assessment & Plan Note (Signed)
Chronic, stable.  Continue Adderall 20 mg XR.  Refills provided today I looked up patient on [place] Controlled Substances Reporting System PMP AWARE and saw no activity that raised concern of inappropriate use.

## 2023-02-12 NOTE — Assessment & Plan Note (Signed)
Lab Results  Component Value Date   HGBA1C 6.8 (A) 10/13/2022   From FBG , anticipate similar A1c.  Advised to increase Ozempic to 1 mg, and start metformin 500 mg again.  Advised her to discontinue glargine altogether due to risk of hypoglycemia

## 2023-02-28 ENCOUNTER — Ambulatory Visit: Payer: No Typology Code available for payment source | Admitting: Family

## 2023-03-05 ENCOUNTER — Ambulatory Visit: Payer: No Typology Code available for payment source | Admitting: Family

## 2023-07-22 ENCOUNTER — Other Ambulatory Visit: Payer: Self-pay | Admitting: Family

## 2023-07-22 DIAGNOSIS — E119 Type 2 diabetes mellitus without complications: Secondary | ICD-10-CM

## 2023-08-16 ENCOUNTER — Encounter: Payer: Self-pay | Admitting: Family

## 2023-08-17 ENCOUNTER — Other Ambulatory Visit: Payer: Self-pay

## 2023-08-17 DIAGNOSIS — I10 Essential (primary) hypertension: Secondary | ICD-10-CM

## 2023-08-17 MED ORDER — AMLODIPINE BESYLATE 10 MG PO TABS
10.0000 mg | ORAL_TABLET | Freq: Every day | ORAL | 2 refills | Status: DC
Start: 2023-08-17 — End: 2024-04-09

## 2023-12-23 ENCOUNTER — Other Ambulatory Visit: Payer: Self-pay | Admitting: Family

## 2023-12-23 DIAGNOSIS — E119 Type 2 diabetes mellitus without complications: Secondary | ICD-10-CM

## 2024-03-01 ENCOUNTER — Other Ambulatory Visit: Payer: Self-pay | Admitting: Family

## 2024-03-01 DIAGNOSIS — F32A Depression, unspecified: Secondary | ICD-10-CM

## 2024-03-28 ENCOUNTER — Encounter: Payer: Self-pay | Admitting: Family

## 2024-03-28 ENCOUNTER — Other Ambulatory Visit: Payer: Self-pay

## 2024-03-28 DIAGNOSIS — I10 Essential (primary) hypertension: Secondary | ICD-10-CM

## 2024-03-28 MED ORDER — LOSARTAN POTASSIUM 100 MG PO TABS
100.0000 mg | ORAL_TABLET | Freq: Every day | ORAL | 1 refills | Status: DC
Start: 2024-03-28 — End: 2024-04-21

## 2024-04-09 ENCOUNTER — Encounter: Payer: Self-pay | Admitting: Family

## 2024-04-09 ENCOUNTER — Ambulatory Visit: Admitting: Family

## 2024-04-09 VITALS — BP 128/82 | HR 82 | Temp 98.0°F | Ht 64.0 in | Wt 192.6 lb

## 2024-04-09 DIAGNOSIS — Z7985 Long-term (current) use of injectable non-insulin antidiabetic drugs: Secondary | ICD-10-CM

## 2024-04-09 DIAGNOSIS — Z794 Long term (current) use of insulin: Secondary | ICD-10-CM

## 2024-04-09 DIAGNOSIS — E118 Type 2 diabetes mellitus with unspecified complications: Secondary | ICD-10-CM | POA: Diagnosis not present

## 2024-04-09 DIAGNOSIS — I10 Essential (primary) hypertension: Secondary | ICD-10-CM

## 2024-04-09 DIAGNOSIS — F909 Attention-deficit hyperactivity disorder, unspecified type: Secondary | ICD-10-CM

## 2024-04-09 DIAGNOSIS — F32A Depression, unspecified: Secondary | ICD-10-CM

## 2024-04-09 DIAGNOSIS — E119 Type 2 diabetes mellitus without complications: Secondary | ICD-10-CM

## 2024-04-09 DIAGNOSIS — F419 Anxiety disorder, unspecified: Secondary | ICD-10-CM

## 2024-04-09 DIAGNOSIS — R7309 Other abnormal glucose: Secondary | ICD-10-CM

## 2024-04-09 LAB — COMPREHENSIVE METABOLIC PANEL WITH GFR
ALT: 44 U/L — ABNORMAL HIGH (ref 0–35)
AST: 34 U/L (ref 0–37)
Albumin: 4 g/dL (ref 3.5–5.2)
Alkaline Phosphatase: 73 U/L (ref 39–117)
BUN: 13 mg/dL (ref 6–23)
CO2: 29 meq/L (ref 19–32)
Calcium: 9.3 mg/dL (ref 8.4–10.5)
Chloride: 96 meq/L (ref 96–112)
Creatinine, Ser: 0.75 mg/dL (ref 0.40–1.20)
GFR: 96.59 mL/min (ref >60.00)
Glucose, Bld: 241 mg/dL — ABNORMAL HIGH (ref 70–99)
Potassium: 4.2 meq/L (ref 3.5–5.1)
Sodium: 131 meq/L — ABNORMAL LOW (ref 135–145)
Total Bilirubin: 0.3 mg/dL (ref 0.2–1.2)
Total Protein: 7.7 g/dL (ref 6.0–8.3)

## 2024-04-09 LAB — CBC WITH DIFFERENTIAL/PLATELET
Basophils Absolute: 0.1 10*3/uL (ref 0.0–0.1)
Basophils Relative: 0.9 % (ref 0.0–3.0)
Eosinophils Absolute: 0.1 10*3/uL (ref 0.0–0.7)
Eosinophils Relative: 1.9 % (ref 0.0–5.0)
HCT: 36.2 % (ref 36.0–46.0)
Hemoglobin: 12 g/dL (ref 12.0–15.0)
Lymphocytes Relative: 25.1 % (ref 12.0–46.0)
Lymphs Abs: 1.6 10*3/uL (ref 0.7–4.0)
MCHC: 33 g/dL (ref 30.0–36.0)
MCV: 83.9 fl (ref 78.0–100.0)
Monocytes Absolute: 0.6 10*3/uL (ref 0.1–1.0)
Monocytes Relative: 9 % (ref 3.0–12.0)
Neutro Abs: 4 10*3/uL (ref 1.4–7.7)
Neutrophils Relative %: 63.1 % (ref 43.0–77.0)
Platelets: 312 10*3/uL (ref 150.0–400.0)
RBC: 4.31 Mil/uL (ref 3.87–5.11)
RDW: 13.1 % (ref 11.5–15.5)
WBC: 6.4 10*3/uL (ref 4.0–10.5)

## 2024-04-09 LAB — MICROALBUMIN / CREATININE URINE RATIO
Creatinine,U: 76.5 mg/dL
Microalb Creat Ratio: UNDETERMINED mg/g (ref 0.0–30.0)
Microalb, Ur: <0.7 mg/dL

## 2024-04-09 LAB — LIPID PANEL
Cholesterol: 214 mg/dL — ABNORMAL HIGH (ref 0–200)
HDL: 67.6 mg/dL (ref >39.00)
LDL Cholesterol: 127 mg/dL — ABNORMAL HIGH (ref 0–99)
NonHDL: 146.36
Total CHOL/HDL Ratio: 3
Triglycerides: 95 mg/dL (ref 0.0–149.0)
VLDL: 19 mg/dL (ref 0.0–40.0)

## 2024-04-09 LAB — POCT GLYCOSYLATED HEMOGLOBIN (HGB A1C): Hemoglobin A1C: 8.1 % — AB (ref 4.0–5.6)

## 2024-04-09 LAB — VITAMIN D 25 HYDROXY (VIT D DEFICIENCY, FRACTURES): VITD: 17.04 ng/mL — ABNORMAL LOW (ref 30.00–100.00)

## 2024-04-09 LAB — TSH: TSH: 2.78 u[IU]/mL (ref 0.35–5.50)

## 2024-04-09 MED ORDER — AMPHETAMINE-DEXTROAMPHET ER 20 MG PO CP24: 20.0000 mg | ORAL_CAPSULE | Freq: Every day | ORAL | 0 refills | Status: DC

## 2024-04-09 MED ORDER — METFORMIN HCL ER 500 MG PO TB24: 500.0000 mg | ORAL_TABLET | Freq: Every evening | ORAL | 3 refills | Status: AC

## 2024-04-09 MED ORDER — AMPHETAMINE-DEXTROAMPHET ER 20 MG PO CP24: 20.0000 mg | ORAL_CAPSULE | ORAL | 0 refills | Status: DC

## 2024-04-09 MED ORDER — ROSUVASTATIN CALCIUM 40 MG PO TABS: 40.0000 mg | ORAL_TABLET | Freq: Every day | ORAL | 3 refills | Status: AC

## 2024-04-09 MED ORDER — SEMAGLUTIDE(0.25 OR 0.5MG/DOS) 2 MG/3ML ~~LOC~~ SOPN: 0.2500 mg | PEN_INJECTOR | SUBCUTANEOUS | 1 refills | Status: DC

## 2024-04-09 MED ORDER — AMLODIPINE BESYLATE 10 MG PO TABS: 10.0000 mg | ORAL_TABLET | Freq: Every day | ORAL | 3 refills | Status: AC

## 2024-04-09 NOTE — Assessment & Plan Note (Signed)
Chronic, stable.  Continue amlodipine 10 mg, losartan 100 mg 

## 2024-04-09 NOTE — Assessment & Plan Note (Signed)
Chronic, stable.  Continue Prozac 40mg.  

## 2024-04-09 NOTE — Assessment & Plan Note (Signed)
Chronic, stable.  Continue Adderall 20 mg XR.  Refills provided today I looked up patient on [place] Controlled Substances Reporting System PMP AWARE and saw no activity that raised concern of inappropriate use.   

## 2024-04-09 NOTE — Assessment & Plan Note (Signed)
 Uncontrolled.  Restart Ozempic  0.25 mg weekly, metformin  500 mg daily.  She does not require Basaglar  at this time.  Advised her to start wearing her libre.

## 2024-04-09 NOTE — Patient Instructions (Addendum)
 Resume Ozempic  and metformin    start Libre sensor  Goal of fasting blood sugar is between 70-120. If in this range, we are reaching our target a1c ( goal 6.5%)   Please check fasting blood sugar in the morning time once or twice a week.  You may also check if you feel like you are having a low episode or particularly high episode of blood sugar.  If blood sugars increase, I may advise you to check blood sugar after your largest meal.  You specifically do this TWO hours after largest meal with the goal of being less than 180.  If blood sugar is checked sooner than 2 hours after largest meal, and it will be  expected to be elevated. You must wait 2 hours.   If your blood sugar is less than 180 hours after your largest meal, again we are reaching our target a1c goal   Call [facility] clinic if: BG < 70 or > 300.   If you have any symptoms of low blood sugar ( sweating, shakiness, lightheaded, dizzy) that you notify me. If you have a low, please drink a glass of orange juice and recheck blood sugar every 5 minutes until you don't feel symptomatic AND blood sugar is above 80.

## 2024-04-09 NOTE — Progress Notes (Signed)
 Assessment & Plan:  Elevated glucose -     POCT glycosylated hemoglobin (Hb A1C)  Type 2 diabetes mellitus with complication, with long-term current use of insulin  (HCC) -     Lipid panel -     Microalbumin / creatinine urine ratio -     metFORMIN  HCl ER; Take 1 tablet (500 mg total) by mouth every evening.  Dispense: 90 tablet; Refill: 3 -     Rosuvastatin  Calcium ; Take 1 tablet (40 mg total) by mouth daily.  Dispense: 90 tablet; Refill: 3  Benign essential HTN Assessment & Plan: Chronic, stable.  Continue amlodipine10 mg, losartan  100mg   Orders: -     VITAMIN D  25 Hydroxy (Vit-D Deficiency, Fractures) -     CBC with Differential/Platelet -     Comprehensive metabolic panel with GFR -     TSH -     amLODIPine  Besylate; Take 1 tablet (10 mg total) by mouth daily.  Dispense: 90 tablet; Refill: 3  Attention deficit hyperactivity disorder (ADHD), unspecified ADHD type Assessment & Plan: Chronic, stable.  Continue Adderall 20 mg XR.  Refills provided today I looked up patient on [place] Controlled Substances Reporting System PMP AWARE and saw no activity that raised concern of inappropriate use.    Orders: -     Amphetamine -Dextroamphet ER; Take 1 capsule (20 mg total) by mouth every morning.  Dispense: 30 capsule; Refill: 0 -     Amphetamine -Dextroamphet ER; Take 1 capsule (20 mg total) by mouth daily.  Dispense: 30 capsule; Refill: 0 -     Amphetamine -Dextroamphet ER; Take 1 capsule (20 mg total) by mouth every morning.  Dispense: 30 capsule; Refill: 0  Anxiety and depression Assessment & Plan: Chronic, stable.  Continue Prozac  40 mg   Type 2 diabetes mellitus without complication, with long-term current use of insulin  (HCC) Assessment & Plan: Uncontrolled.  Restart Ozempic  0.25 mg weekly, metformin  500 mg daily.  She does not require Basaglar  at this time.  Advised her to start wearing her libre.     Other orders -     Semaglutide (0.25 or 0.5MG /DOS); Inject 0.25 mg into the  skin once a week.  Dispense: 3 mL; Refill: 1     Return precautions given.   Risks, benefits, and alternatives of the medications and treatment plan prescribed today were discussed, and patient expressed understanding.   Education regarding symptom management and diagnosis given to patient on AVS either electronically or printed.  Return in about 3 months (around 07/09/2024) for Complete Physical Exam.  Bascom Bossier, FNP  Subjective:    Patient ID: Carol [name], female    DOB: [birth year], 43 y.o.   MRN: [id number]  CC: Carol [name] is a 43 y.o. female who presents today for follow up.   HPI: Overall feels well today.  No new complaints.   Requesting medication refills.  She has been off of Ozempic  and metformin  for some time.  Previously on Basaglar .  she has not been checking her blood sugar; she has libre at home.  Endorses polyuria.  Denies dysuria. She request refill of Adderall 20 mg daily.  This medication has been helpful for her.  Denies increased anxiety, chest pain or trouble sleeping.  She is doing well on Prozac  40 mg  Allergies: Milk (cow) Current Outpatient Medications on File Prior to Visit  Medication Sig Dispense Refill   FLUoxetine  (PROZAC ) 40 MG capsule TAKE 1 CAPSULE (40 MG TOTAL) BY MOUTH DAILY. 90 capsule 3   glucose  blood test strip Use as instructed up to 2 times per day 100 each 12   losartan  (COZAAR ) 100 MG tablet Take 1 tablet (100 mg total) by mouth daily. 30 tablet 1   Continuous Blood Gluc Receiver (FREESTYLE LIBRE 2 READER) DEVI 1 Device by Does not apply route daily. (Patient not taking: Reported on 04/09/2024) 1 each 0   Continuous Blood Gluc Sensor (FREESTYLE LIBRE 2 SENSOR) MISC 2 Devices by Does not apply route every 14 (fourteen) days. (Patient not taking: Reported on 04/09/2024) 2 each 4   etonogestrel  (NEXPLANON ) 68 MG IMPL implant 1 each by Subdermal route once. (Patient not taking: Reported on 04/09/2024)     No current  facility-administered medications on file prior to visit.    Review of Systems  Constitutional:  Negative for chills and fever.  Respiratory:  Negative for cough.   Cardiovascular:  Negative for chest pain and palpitations.  Gastrointestinal:  Negative for nausea and vomiting.  Psychiatric/Behavioral:  Negative for sleep disturbance.       Objective:    BP 128/82   Pulse 82   Temp 98 F (36.7 C) (Oral)   Ht 5\' 4"  (1.626 m)   Wt 192 lb 9.6 oz (87.4 kg)   LMP  (LMP Unknown)   SpO2 98%   BMI 33.06 kg/m  BP Readings from Last 3 Encounters:  04/09/24 128/82  10/13/22 126/78  02/22/22 128/78   Wt Readings from Last 3 Encounters:  04/09/24 192 lb 9.6 oz (87.4 kg)  10/13/22 179 lb 3.2 oz (81.3 kg)  02/22/22 193 lb 12.8 oz (87.9 kg)      04/09/2024   10:18 AM 02/12/2023    2:28 PM 10/13/2022    9:04 AM  Depression screen PHQ 2/9  Decreased Interest 0 1 0  Down, Depressed, Hopeless 0 1 0  PHQ - 2 Score 0 2 0  Altered sleeping  2   Tired, decreased energy  2   Change in appetite  0   Feeling bad or failure about yourself   1   Trouble concentrating  2   Moving slowly or fidgety/restless  0   Suicidal thoughts  0   PHQ-9 Score  9   Difficult doing work/chores  Somewhat difficult      Physical Exam Vitals reviewed.  Constitutional:      Appearance: She is well-developed.  Eyes:     Conjunctiva/sclera: Conjunctivae normal.  Cardiovascular:     Rate and Rhythm: Normal rate and regular rhythm.     Pulses: Normal pulses.     Heart sounds: Normal heart sounds.  Pulmonary:     Effort: Pulmonary effort is normal.     Breath sounds: Normal breath sounds. No wheezing, rhonchi or rales.  Skin:    General: Skin is warm and dry.  Neurological:     Mental Status: She is alert.  Psychiatric:        Speech: Speech normal.        Behavior: Behavior normal.        Thought Content: Thought content normal.

## 2024-04-10 ENCOUNTER — Encounter: Payer: Self-pay | Admitting: Family

## 2024-04-15 ENCOUNTER — Ambulatory Visit: Admitting: Family

## 2024-04-21 ENCOUNTER — Other Ambulatory Visit: Payer: Self-pay | Admitting: Family

## 2024-04-21 DIAGNOSIS — I10 Essential (primary) hypertension: Secondary | ICD-10-CM

## 2024-06-06 ENCOUNTER — Other Ambulatory Visit: Payer: Self-pay

## 2024-06-06 ENCOUNTER — Encounter: Payer: Self-pay | Admitting: Family

## 2024-06-06 DIAGNOSIS — I10 Essential (primary) hypertension: Secondary | ICD-10-CM

## 2024-06-06 MED ORDER — LOSARTAN POTASSIUM 100 MG PO TABS: 100.0000 mg | ORAL_TABLET | Freq: Every day | ORAL | 1 refills | Status: DC

## 2024-07-16 ENCOUNTER — Other Ambulatory Visit: Payer: Self-pay | Admitting: Family

## 2024-09-17 ENCOUNTER — Ambulatory Visit: Admitting: Family

## 2024-09-18 ENCOUNTER — Ambulatory Visit: Admitting: Family

## 2024-09-18 ENCOUNTER — Encounter: Payer: Self-pay | Admitting: Family

## 2024-09-18 ENCOUNTER — Ambulatory Visit: Payer: Self-pay | Admitting: Family

## 2024-09-18 VITALS — BP 136/82 | HR 90 | Temp 98.4°F | Ht 64.0 in | Wt 195.2 lb

## 2024-09-18 DIAGNOSIS — R7309 Other abnormal glucose: Secondary | ICD-10-CM

## 2024-09-18 DIAGNOSIS — E119 Type 2 diabetes mellitus without complications: Secondary | ICD-10-CM | POA: Diagnosis not present

## 2024-09-18 DIAGNOSIS — Z1231 Encounter for screening mammogram for malignant neoplasm of breast: Secondary | ICD-10-CM | POA: Diagnosis not present

## 2024-09-18 DIAGNOSIS — F909 Attention-deficit hyperactivity disorder, unspecified type: Secondary | ICD-10-CM

## 2024-09-18 DIAGNOSIS — Z1211 Encounter for screening for malignant neoplasm of colon: Secondary | ICD-10-CM | POA: Diagnosis not present

## 2024-09-18 DIAGNOSIS — Z794 Long term (current) use of insulin: Secondary | ICD-10-CM

## 2024-09-18 DIAGNOSIS — I10 Essential (primary) hypertension: Secondary | ICD-10-CM

## 2024-09-18 LAB — COMPREHENSIVE METABOLIC PANEL WITH GFR
ALT: 15 U/L (ref 0–35)
AST: 13 U/L (ref 0–37)
Albumin: 4.1 g/dL (ref 3.5–5.2)
Alkaline Phosphatase: 88 U/L (ref 39–117)
BUN: 15 mg/dL (ref 6–23)
CO2: 31 meq/L (ref 19–32)
Calcium: 9.6 mg/dL (ref 8.4–10.5)
Chloride: 97 meq/L (ref 96–112)
Creatinine, Ser: 0.92 mg/dL (ref 0.40–1.20)
GFR: 75.36 mL/min (ref >60.00)
Glucose, Bld: 356 mg/dL — ABNORMAL HIGH (ref 70–99)
Potassium: 4 meq/L (ref 3.5–5.1)
Sodium: 134 meq/L — ABNORMAL LOW (ref 135–145)
Total Bilirubin: 0.3 mg/dL (ref 0.2–1.2)
Total Protein: 8 g/dL (ref 6.0–8.3)

## 2024-09-18 LAB — POCT GLYCOSYLATED HEMOGLOBIN (HGB A1C): Hemoglobin A1C: 8.6 % — AB (ref 4.0–5.6)

## 2024-09-18 MED ORDER — AMPHETAMINE-DEXTROAMPHET ER 20 MG PO CP24: 20.0000 mg | ORAL_CAPSULE | ORAL | 0 refills | Status: AC

## 2024-09-18 MED ORDER — SEMAGLUTIDE (1 MG/DOSE) 4 MG/3ML ~~LOC~~ SOPN: 1.0000 mg | PEN_INJECTOR | SUBCUTANEOUS | 2 refills | Status: DC

## 2024-09-18 MED ORDER — AMPHETAMINE-DEXTROAMPHET ER 20 MG PO CP24: 20.0000 mg | ORAL_CAPSULE | Freq: Every day | ORAL | 0 refills | Status: AC

## 2024-09-18 NOTE — Patient Instructions (Addendum)
 Call your insurance company and ask them what is on the preferred formulary- Trulicity Mounjaro   We may consider jardiance  once we have tighter glycemic control.  I would like to know what options we have. We can also return to insulin  as well.   Please call  and schedule your 3D mammogram and /or bone density scan as we discussed.   [facility]  ( new location in 2023)  [address], [place], [place] [address]  [place], [place]  [phone]   Referral for colonoscopy  Let us  know if you dont hear back within 2 weeks in regards to an appointment being scheduled.   So that you are aware, if you are [facility] MyChart user , please pay attention to your MyChart messages as you may receive a MyChart message with a phone number to call and schedule this test/appointment own your own from our referral coordinator. This is a new process so I do not want you to miss this message.  If you are not a MyChart user, you will receive a phone call.

## 2024-09-18 NOTE — Progress Notes (Unsigned)
 Assessment & Plan:  Type 2 diabetes mellitus without complication, with long-term current use of insulin  [facility]) Assessment & Plan: Chronic, uncontrolled. Increase ozempic  1mg . Patient will let me know in regards to cost of medication and if we need to make a change. Metformin  500mg  can cause GI upset; we will not increase metformin  and ultimately would like to wean off this medication to solely be on ozempic .   Orders: -     Semaglutide  (1 MG/DOSE); Inject 1 mg as directed once a week.  Dispense: 3 mL; Refill: 2 -     Comprehensive metabolic panel with GFR  Elevated glucose -     POCT glycosylated hemoglobin (Hb A1C)  Attention deficit hyperactivity disorder (ADHD), unspecified ADHD type Assessment & Plan: Chronic, stable.  Continue Adderall 20 mg XR.  Refills provided today I looked up patient on [place] Controlled Substances Reporting System PMP AWARE and saw no activity that raised concern of inappropriate use.    Orders: -     Amphetamine -Dextroamphet ER; Take 1 capsule (20 mg total) by mouth daily.  Dispense: 30 capsule; Refill: 0 -     Amphetamine -Dextroamphet ER; Take 1 capsule (20 mg total) by mouth every morning.  Dispense: 30 capsule; Refill: 0 -     Amphetamine -Dextroamphet ER; Take 1 capsule (20 mg total) by mouth every morning.  Dispense: 30 capsule; Refill: 0  Screen for colon cancer -     Ambulatory referral to Gastroenterology  Encounter for screening mammogram for malignant neoplasm of breast -     3D Screening Mammogram, Left and Right; Future  Benign essential HTN Assessment & Plan: Chronic, stable.  Continue amlodipine10 mg, losartan  100mg       Return precautions given.   Risks, benefits, and alternatives of the medications and treatment plan prescribed today were discussed, and patient expressed understanding.   Education regarding symptom management and diagnosis given to patient on AVS either electronically or printed.  Return in about 3 months (around  12/19/2024) for Complete Physical Exam.  Rollene Northern, FNP  Subjective:    Patient ID: Carol [name], female    DOB: [birth year], 43 y.o.   MRN: [id number]  CC: Carol [name] is a 43 y.o. female who presents today for medicaion follow up.   HPI: Feels well today No new complaints  Insurance has been strange in covering ozempic , cost ranges from $289- $90.  She has ozempic  0.5mg   No constipation, nausea.  She takes metformin  500mg   'off and on' due to nausea.    She has missed 2 days of amlodipine  10mg  this week. She is compliant with losartan  100mg .   She requests refill of adderall; she has not refilled in months. Adderall dose is quite helpful for her.   Denies SOB, CP, palpitations.   She declines colonoscopy referral at this time.    Allergies: Milk (cow) and Jardiance  [empagliflozin ] Current Outpatient Medications on File Prior to Visit  Medication Sig Dispense Refill   amLODipine  (NORVASC ) 10 MG tablet Take 1 tablet (10 mg total) by mouth daily. 90 tablet 3   FLUoxetine  (PROZAC ) 40 MG capsule TAKE 1 CAPSULE (40 MG TOTAL) BY MOUTH DAILY. 90 capsule 3   glucose blood test strip Use as instructed up to 2 times per day 100 each 12   losartan  (COZAAR ) 100 MG tablet Take 1 tablet (100 mg total) by mouth daily. 90 tablet 1   metFORMIN  (GLUCOPHAGE  XR) 500 MG 24 hr tablet Take 1 tablet (500 mg total) by mouth  every evening. 90 tablet 3   rosuvastatin  (CRESTOR ) 40 MG tablet Take 1 tablet (40 mg total) by mouth daily. 90 tablet 3   Continuous Blood Gluc Receiver (FREESTYLE LIBRE 2 READER) DEVI 1 Device by Does not apply route daily. (Patient not taking: Reported on 09/18/2024) 1 each 0   Continuous Blood Gluc Sensor (FREESTYLE LIBRE 2 SENSOR) MISC 2 Devices by Does not apply route every 14 (fourteen) days. (Patient not taking: Reported on 09/18/2024) 2 each 4   etonogestrel  (NEXPLANON ) 68 MG IMPL implant 1 each by Subdermal route once. (Patient not taking: Reported on 09/18/2024)      No current facility-administered medications on file prior to visit.    Review of Systems  Constitutional:  Negative for chills and fever.  Respiratory:  Negative for cough.   Cardiovascular:  Negative for chest pain and palpitations.  Gastrointestinal:  Negative for nausea and vomiting.      Objective:    BP 136/82   Pulse 90   Temp 98.4 F (36.9 C) (Oral)   Ht 5' 4 (1.626 m)   Wt 195 lb 3.2 oz (88.5 kg)   LMP  (LMP Unknown)   SpO2 96%   BMI 33.51 kg/m  BP Readings from Last 3 Encounters:  09/18/24 136/82  04/09/24 128/82  10/13/22 126/78   Wt Readings from Last 3 Encounters:  09/18/24 195 lb 3.2 oz (88.5 kg)  04/09/24 192 lb 9.6 oz (87.4 kg)  10/13/22 179 lb 3.2 oz (81.3 kg)    Physical Exam Vitals reviewed.  Constitutional:      Appearance: She is well-developed.  Eyes:     Conjunctiva/sclera: Conjunctivae normal.  Cardiovascular:     Rate and Rhythm: Normal rate and regular rhythm.     Pulses: Normal pulses.     Heart sounds: Normal heart sounds.  Pulmonary:     Effort: Pulmonary effort is normal.     Breath sounds: Normal breath sounds. No wheezing, rhonchi or rales.  Skin:    General: Skin is warm and dry.  Neurological:     Mental Status: She is alert.  Psychiatric:        Speech: Speech normal.        Behavior: Behavior normal.        Thought Content: Thought content normal.

## 2024-09-19 NOTE — Assessment & Plan Note (Signed)
Chronic, stable.  Continue amlodipine 10 mg, losartan 100 mg 

## 2024-09-19 NOTE — Assessment & Plan Note (Signed)
 Chronic, uncontrolled. Increase ozempic  1mg . Patient will let me know in regards to cost of medication and if we need to make a change. Metformin  500mg  can cause GI upset; we will not increase metformin  and ultimately would like to wean off this medication to solely be on ozempic .

## 2024-09-19 NOTE — Assessment & Plan Note (Signed)
Chronic, stable.  Continue Adderall 20 mg XR.  Refills provided today I looked up patient on [place] Controlled Substances Reporting System PMP AWARE and saw no activity that raised concern of inappropriate use.   

## 2024-09-24 ENCOUNTER — Telehealth: Payer: Self-pay

## 2024-09-24 DIAGNOSIS — Z1211 Encounter for screening for malignant neoplasm of colon: Secondary | ICD-10-CM

## 2024-09-24 NOTE — Telephone Encounter (Signed)
 Gastroenterology Pre-Procedure Review  Request Date: TBD Requesting Physician: Dr. NELLIE  PATIENT REVIEW QUESTIONS: The patient responded to the following health history questions as indicated:    1. Are you having any GI issues? no 2. Do you have a personal history of Polyps? no 3. Do you have a family history of Colon Cancer or Polyps? yes (maternal aunt colon cancer) 4. Diabetes Mellitus? yes (currently taking Semaglutide  and Metformin .  Verbally advised and noted to stop Semaglutide  (7) days prior and stop Metformin  (2) days prior) 5. Joint replacements in the past 12 months?no 6. Major health problems in the past 3 months?no 7. Any artificial heart valves, MVP, or defibrillator?no    MEDICATIONS & ALLERGIES:    Patient reports the following regarding taking any anticoagulation/antiplatelet therapy:   Plavix, Coumadin, Eliquis, Xarelto, Lovenox, Pradaxa, Brilinta, or Effient? no Aspirin? no  Patient confirms/reports the following medications:  Current Outpatient Medications  Medication Sig Dispense Refill   glucose blood test strip Use as instructed up to 2 times per day 100 each 12   losartan  (COZAAR ) 100 MG tablet Take 1 tablet (100 mg total) by mouth daily. 90 tablet 1   rosuvastatin  (CRESTOR ) 40 MG tablet Take 1 tablet (40 mg total) by mouth daily. 90 tablet 3   Semaglutide , 1 MG/DOSE, 4 MG/3ML SOPN Inject 1 mg as directed once a week. 3 mL 2   amLODipine  (NORVASC ) 10 MG tablet Take 1 tablet (10 mg total) by mouth daily. 90 tablet 3   amphetamine -dextroamphetamine (ADDERALL XR) 20 MG 24 hr capsule Take 1 capsule (20 mg total) by mouth daily. 30 capsule 0   amphetamine -dextroamphetamine (ADDERALL XR) 20 MG 24 hr capsule Take 1 capsule (20 mg total) by mouth every morning. 30 capsule 0   amphetamine -dextroamphetamine (ADDERALL XR) 20 MG 24 hr capsule Take 1 capsule (20 mg total) by mouth every morning. 30 capsule 0   Continuous Blood Gluc Receiver (FREESTYLE LIBRE 2 READER) DEVI 1  Device by Does not apply route daily. (Patient not taking: Reported on 09/18/2024) 1 each 0   Continuous Blood Gluc Sensor (FREESTYLE LIBRE 2 SENSOR) MISC 2 Devices by Does not apply route every 14 (fourteen) days. (Patient not taking: Reported on 09/18/2024) 2 each 4   etonogestrel  (NEXPLANON ) 68 MG IMPL implant 1 each by Subdermal route once. (Patient not taking: Reported on 09/24/2024)     FLUoxetine  (PROZAC ) 40 MG capsule TAKE 1 CAPSULE (40 MG TOTAL) BY MOUTH DAILY. 90 capsule 3   metFORMIN  (GLUCOPHAGE  XR) 500 MG 24 hr tablet Take 1 tablet (500 mg total) by mouth every evening. (Patient not taking: Reported on 09/24/2024) 90 tablet 3   No current facility-administered medications for this visit.    Patient confirms/reports the following allergies:  Allergies  Allergen Reactions   Milk (Cow) Nausea Only and Swelling    Gas, nausea, lip swelling   Jardiance  [Empagliflozin ] Other (See Comments)    Had yeast infections on jardiance .     No orders of the defined types were placed in this encounter.   AUTHORIZATION INFORMATION Primary Insurance: 1D#: Group #:  Secondary Insurance: 1D#: Group #:  SCHEDULE INFORMATION: Date: TBD Time: Location: [facility]

## 2024-10-17 ENCOUNTER — Encounter: Payer: Self-pay | Admitting: Family

## 2024-10-17 NOTE — Telephone Encounter (Signed)
 Carol [name],  Pt would like to switch back to the previous insulin  medication she was on due to Ozempic  medication being to expensive

## 2024-10-18 ENCOUNTER — Other Ambulatory Visit: Payer: Self-pay | Admitting: Family

## 2024-10-21 NOTE — Telephone Encounter (Signed)
 Called pt I asked if How much more ozempic  does she have left she stated none. She isn't wearing her Herlene pt stated she has one more left on tha. I was unable to get libre readings on he/r. video visit scheduled 11/04/2024. I called  pharmacy she never picked up the medication due to cost. I asked  Pharmcist  how much it is with her insurance and [facility] pharmacist stated $183.00 for her portion

## 2024-10-22 ENCOUNTER — Telehealth: Payer: Self-pay | Admitting: Pharmacist

## 2024-10-22 DIAGNOSIS — Z794 Long term (current) use of insulin: Secondary | ICD-10-CM

## 2024-10-22 NOTE — Progress Notes (Unsigned)
 Brief Telephone Documentation Reason for Call: Medication Access  Summary of Call: Ozempic  unaffordable at the pharmacy ~$183 copay with current insurance.   Prescription covered:   Called [facility] to confirm if using savings card already or not. Appears they were already using savings card.    Cost without savings card = $282.  With savings card = $183 Maximum monthly savings = -$100.    Considerations: Mounjaro card -$150/month Rybelsus  card -$300/month Trulicity card -$150/month   Test claim  Mounjaro $309 (-$150) Trulicity $282 (-$150Rybelsus  $285 (-$300)   Discussed costs with patient.  Considerations forwarded to PCP.   Follow Up: Patient given direct line for further questions/concerns.  Carol [name], PharmD Clinical Pharmacist [facility] [phone]

## 2024-10-23 ENCOUNTER — Other Ambulatory Visit (HOSPITAL_COMMUNITY): Payer: Self-pay

## 2024-10-23 NOTE — Telephone Encounter (Signed)
 Noted

## 2024-10-23 NOTE — Telephone Encounter (Signed)
 Looks like it should be $309.06

## 2024-10-30 NOTE — Telephone Encounter (Signed)
 Called pt she states she is not currently taking Ozempic  because her insurance doesn't cover it. She has a V.V with you on 11/04/2024 to discuss Ozempic 

## 2024-10-31 ENCOUNTER — Other Ambulatory Visit: Payer: Self-pay | Admitting: Family

## 2024-10-31 DIAGNOSIS — Z794 Long term (current) use of insulin: Secondary | ICD-10-CM

## 2024-10-31 MED ORDER — INSULIN GLARGINE 100 UNIT/ML ~~LOC~~ SOLN: 10.0000 [IU] | Freq: Every day | SUBCUTANEOUS | 11 refills | Status: AC

## 2024-10-31 NOTE — Telephone Encounter (Signed)
 Called pt she is NOT TAKING OZEMPIC  AT THIS TIME due to insurance not covering it. She is taking metformin  500mg  on and off because it makes her nauseous. She will send you a Mychart messages as well

## 2024-10-31 NOTE — Telephone Encounter (Signed)
 Called pt this morning but was unable to reach her. I did leave a voicemail stating to call our office back. I did mention she can check her Mychart and respond that way as well. I'll giver her a call later this morning

## 2024-11-04 ENCOUNTER — Encounter: Payer: Self-pay | Admitting: Family

## 2024-11-04 ENCOUNTER — Telehealth (INDEPENDENT_AMBULATORY_CARE_PROVIDER_SITE_OTHER): Admitting: Family

## 2024-11-04 VITALS — Wt 190.0 lb

## 2024-11-04 DIAGNOSIS — Z794 Long term (current) use of insulin: Secondary | ICD-10-CM

## 2024-11-04 DIAGNOSIS — E119 Type 2 diabetes mellitus without complications: Secondary | ICD-10-CM

## 2024-11-04 MED ORDER — LANCET DEVICE MISC: 1.0000 | 0 refills | Status: AC

## 2024-11-04 MED ORDER — BLOOD GLUCOSE TEST VI STRP: 1.0000 | ORAL_STRIP | 0 refills | Status: AC

## 2024-11-04 MED ORDER — BLOOD GLUCOSE MONITORING SUPPL DEVI: 1.0000 | 0 refills | Status: AC

## 2024-11-04 MED ORDER — FREESTYLE LIBRE 2 READER DEVI: 1.0000 | Freq: Every day | 3 refills | Status: AC

## 2024-11-04 MED ORDER — LANCETS MISC: 1.0000 | 0 refills | Status: AC

## 2024-11-04 NOTE — Assessment & Plan Note (Signed)
 Concern for acute hyperglycemia.  Unable to collect data glucose this morning over virtual visit.  Patient does not have working glucometer.  I have refilled glucometer, strips lancets also provided her with CGM sensor.  Advised her over the importance of blood glucose readings today to ensure that she is stable.  Advised I cannot safely resume insulin  without readings however anticipate blood sugar readings to be elevated. Unable to increase metformin  above 500 mg daily due to GI symptoms.  Advised if blood glucose greater than 165, to start Lantus  10 units every morning.  Advised her to call with blood sugar less than 70 or greater than 250. Close f/u 1 week to review blood sugar.

## 2024-11-04 NOTE — Progress Notes (Signed)
 Virtual Visit via Video Note  I connected with Carol [name] on 11/04/24 at 10:00 AM EST by a video enabled telemedicine application and verified that I am speaking with the correct person using two identifiers. Location patient: home Location provider: work  Persons participating in the virtual visit: patient, provider  I discussed the limitations of evaluation and management by telemedicine and the availability of in person appointments. The patient expressed understanding and agreed to proceed.  HPI: DM follow up She has been off ozempic  She hasn't been checking blood sugar at home; she has no blood sugar readings and her glucometer doesn't work at home. She has not picked lantus  yet.   Compliant with metformin  500mg  every day She endorses increased thirst, some blurry vision.  She can feel that her blood sugars are elevated  A1c 8.6   ROS: See pertinent positives and negatives per HPI.  EXAM:  VITALS per patient if applicable: Wt 190 lb (86.2 kg)   BMI 32.61 kg/m  BP Readings from Last 3 Encounters:  09/18/24 136/82  04/09/24 128/82  10/13/22 126/78   Wt Readings from Last 3 Encounters:  11/04/24 190 lb (86.2 kg)  09/18/24 195 lb 3.2 oz (88.5 kg)  04/09/24 192 lb 9.6 oz (87.4 kg)    GENERAL: alert, oriented, appears well and in no acute distress  HEENT: atraumatic, conjunttiva clear, no obvious abnormalities on inspection of external nose and ears  NECK: normal movements of the head and neck  LUNGS: on inspection no signs of respiratory distress, breathing rate appears normal, no obvious gross SOB, gasping or wheezing  CV: no obvious cyanosis  MS: moves all visible extremities without noticeable abnormality  PSYCH/NEURO: pleasant and cooperative, no obvious depression or anxiety, speech and thought processing grossly intact  ASSESSMENT AND PLAN: Type 2 diabetes mellitus without complication, with long-term current use of insulin  (HCC) Assessment &  Plan: Concern for acute hyperglycemia.  Unable to collect data glucose this morning over virtual visit.  Patient does not have working glucometer.  I have refilled glucometer, strips lancets also provided her with CGM sensor.  Advised her over the importance of blood glucose readings today to ensure that she is stable.  Advised I cannot safely resume insulin  without readings however anticipate blood sugar readings to be elevated. Unable to increase metformin  above 500 mg daily due to GI symptoms.  Advised if blood glucose greater than 165, to start Lantus  10 units every morning.  Advised her to call with blood sugar less than 70 or greater than 250. Close f/u 1 week to review blood sugar.   Orders: -     FreeStyle Libre 2 Reader; 1 Device by Does not apply route daily.  Dispense: 6 each; Refill: 3 -     Blood Glucose Monitoring Suppl; 1 each by Does not apply route as directed. Dispense based on patient and insurance preference. Use up to four times daily as directed. (FOR ICD-10 E10.9, E11.9).  Dispense: 1 each; Refill: 0 -     Blood Glucose Test; 1 each by Does not apply route as directed. Dispense based on patient and insurance preference. Use up to four times daily as directed. (FOR ICD-10 E10.9, E11.9).  Dispense: 100 strip; Refill: 0 -     Lancet Device; 1 each by Does not apply route as directed. Dispense based on patient and insurance preference. Use up to four times daily as directed. (FOR ICD-10 E10.9, E11.9).  Dispense: 1 each; Refill: 0 -  Lancets; 1 each by Does not apply route as directed. Dispense based on patient and insurance preference. Use up to four times daily as directed. (FOR ICD-10 E10.9, E11.9).  Dispense: 100 each; Refill: 0     -we discussed possible serious and likely etiologies, options for evaluation and workup, limitations of telemedicine visit vs in person visit, treatment, treatment risks and precautions. Pt prefers to treat via telemedicine empirically rather then  risking or undertaking an in person visit at this moment.    I discussed the assessment and treatment plan with the patient. The patient was provided an opportunity to ask questions and all were answered. The patient agreed with the plan and demonstrated an understanding of the instructions.   The patient was advised to call back or seek an in-person evaluation if the symptoms worsen or if the condition fails to improve as anticipated.  Advised if desired AVS can be mailed or viewed via MyChart if Mychart user.   Rollene Northern, FNP

## 2024-11-04 NOTE — Telephone Encounter (Signed)
 Noted

## 2024-11-04 NOTE — Patient Instructions (Addendum)
 Call GYN to have new nexplanon  placed  [facility]  [address]  [place], [place] [address]  [phone]    Goal of fasting blood sugar is between 70-120. If in this range, we are reaching our target a1c ( goal 6.5%)   Call me today with blood sugar.  If fasting blood sugars greater than 165 , you may start lantus  insulin  10 units daily in the morning. We will titrate from there.    Please check fasting blood sugar in the morning time once or twice a week.  You may also check if you feel like you are having a low episode or particularly high episode of blood sugar.  If blood sugars increase, I may advise you to check blood sugar after your largest meal.  You specifically do this TWO hours after largest meal with the goal of being less than 180.  If blood sugar is checked sooner than 2 hours after largest meal, and it will be  expected to be elevated. You must wait 2 hours.   If your blood sugar is less than 180 hours after your largest meal, again we are reaching our target a1c goal   Call [facility] clinic if: BG < 70 or > 250.   If you have any symptoms of low blood sugar ( sweating, shakiness, lightheaded, dizzy) that you notify me. If you have a low, please drink a glass of orange juice and recheck blood sugar every 5 minutes until you don't feel symptomatic AND blood sugar is above 80.

## 2024-11-17 NOTE — Telephone Encounter (Signed)
 Contacted patient to let her know that January scope schedule is filling up quickly for [place] and to call me back to schedule for January or February.  Thanks,  Mary Esther, CMA

## 2025-01-23 ENCOUNTER — Other Ambulatory Visit: Payer: Self-pay | Admitting: Family

## 2025-01-23 DIAGNOSIS — I10 Essential (primary) hypertension: Secondary | ICD-10-CM
